# Patient Record
Sex: Male | Born: 1990
Health system: Southern US, Community
[De-identification: ages and names within clinical notes are randomized; demographics above are authoritative.]

## PROBLEM LIST (undated history)

## (undated) DIAGNOSIS — L309 Dermatitis, unspecified: Secondary | ICD-10-CM

## (undated) HISTORY — PX: APPENDECTOMY: SHX54

---

## 2018-01-31 ENCOUNTER — Other Ambulatory Visit: Payer: Self-pay

## 2018-01-31 ENCOUNTER — Emergency Department (HOSPITAL_COMMUNITY)
Admission: EM | Admit: 2018-01-31 | Discharge: 2018-01-31 | Disposition: A | Discharge status: Home / Self Care | Payer: Self-pay | Attending: Emergency Medicine | Admitting: Emergency Medicine

## 2018-01-31 ENCOUNTER — Encounter (HOSPITAL_COMMUNITY): Payer: Self-pay

## 2018-01-31 DIAGNOSIS — Y9389 Activity, other specified: Secondary | ICD-10-CM | POA: Insufficient documentation

## 2018-01-31 DIAGNOSIS — F17200 Nicotine dependence, unspecified, uncomplicated: Secondary | ICD-10-CM | POA: Insufficient documentation

## 2018-01-31 DIAGNOSIS — Z23 Encounter for immunization: Secondary | ICD-10-CM | POA: Insufficient documentation

## 2018-01-31 DIAGNOSIS — Y92009 Unspecified place in unspecified non-institutional (private) residence as the place of occurrence of the external cause: Secondary | ICD-10-CM | POA: Insufficient documentation

## 2018-01-31 DIAGNOSIS — S01551A Open bite of lip, initial encounter: Secondary | ICD-10-CM | POA: Insufficient documentation

## 2018-01-31 DIAGNOSIS — Y999 Unspecified external cause status: Secondary | ICD-10-CM | POA: Insufficient documentation

## 2018-01-31 DIAGNOSIS — W540XXA Bitten by dog, initial encounter: Secondary | ICD-10-CM | POA: Insufficient documentation

## 2018-01-31 MED ORDER — AMOXICILLIN-POT CLAVULANATE 875-125 MG PO TABS: 1.0000 | ORAL_TABLET | Freq: Two times a day (BID) | ORAL | 0 refills | Status: DC

## 2018-01-31 MED ORDER — TETANUS-DIPHTH-ACELL PERTUSSIS 5-2.5-18.5 LF-MCG/0.5 IM SUSP
0.5000 mL | Freq: Once | INTRAMUSCULAR | Status: AC
  Administered 2018-01-31: 0.5 mL via INTRAMUSCULAR
  Filled 2018-01-31: qty 0.5

## 2018-01-31 MED ORDER — AMOXICILLIN-POT CLAVULANATE 875-125 MG PO TABS
1.0000 | ORAL_TABLET | Freq: Once | ORAL | Status: AC
  Administered 2018-01-31: 1 via ORAL
  Filled 2018-01-31: qty 1

## 2018-01-31 NOTE — ED Provider Notes (Signed)
[facility] DEPT Provider Note   CSN: [id number] Arrival date & time: 01/31/18  1621     History   Chief Complaint Chief Complaint  Patient presents with  . Animal Bite    HPI Kyle Terry is a 27 y.o. male who presents to the ED for dog bite to his bottom lip. The injury occurred yesterday. Patient reports that he was playing with his puppy and was in his face and the puppy nipped the patient's bottom lip. Puppy is up to date on immunizations. Patient reports that puppy is not missing any teeth and no foreign body in lip. Patient is not up to date on tetanus. Patient has been cleaning his lip with peroxide.  HPI  History reviewed. No pertinent past medical history.  There are no active problems to display for this patient.   Past Surgical History:  Procedure Laterality Date  . APPENDECTOMY          Home Medications    Prior to Admission medications   Medication Sig Start Date End Date Taking? Authorizing Provider  amoxicillin-clavulanate (AUGMENTIN) 875-125 MG tablet Take 1 tablet by mouth every 12 (twelve) hours. 01/31/18   Ashley Murrain, NP    Family History No family history on file.  Social History Social History   Tobacco Use  . Smoking status: Current Every Day Smoker  Substance Use Topics  . Alcohol use: Yes    Comment: barely  . Drug use: Yes    Types: Marijuana     Allergies   Patient has no known allergies.   Review of Systems Review of Systems  Skin: Positive for wound.  All other systems reviewed and are negative.    Physical Exam Updated Vital Signs BP (!) 136/95 (BP Location: Right Arm)   Pulse 93   Temp 98.2 F (36.8 C) (Oral)   Resp 14   Ht 5\' 7"  (1.702 m)   Wt 63 kg (139 lb)   SpO2 100%   BMI 21.77 kg/m   Physical Exam  Constitutional: He appears well-developed and well-nourished. No distress.  HENT:  Laceration to the inside right side of bottom lip. There is swelling noted,  no drainage. the wound is open.  Eyes: EOM are normal.  Neck: Neck supple.  Cardiovascular: Normal rate.  Pulmonary/Chest: Effort normal.  Abdominal: There is no tenderness.  Musculoskeletal: Normal range of motion.  Neurological: He is alert.  Skin: Skin is warm and dry.  Psychiatric: He has a normal mood and affect.  Nursing note and vitals reviewed.    ED Treatments / Results  Labs (all labs ordered are listed, but only abnormal results are displayed) Labs Reviewed - No data to display  Radiology No results found.  Procedures Procedures (including critical care time)  Medications Ordered in ED Medications  Tdap (BOOSTRIX) injection 0.5 mL (has no administration in time range)  amoxicillin-clavulanate (AUGMENTIN) 875-125 MG per tablet 1 tablet (has no administration in time range)     Initial Impression / Assessment and Plan / ED Course  I have reviewed the triage vital signs and the nursing notes. 27 y.o. male here with dog bite to his lowe lip stable for d/c without fever and does not appear toxic. Will treat for infection with Augmentin. Return precautions discussed with the patient. Patient agrees with plan of care.  Final Clinical Impressions(s) / ED Diagnoses   Final diagnoses:  Open wound of lip due to dog bite    ED Discharge  Orders        Ordered    amoxicillin-clavulanate (AUGMENTIN) 875-125 MG tablet  Every 12 hours     01/31/18 1820       Janit Bern [place], [place] 01/31/18 Greer Ee    Noemi Chapel, MD 02/03/18 1128

## 2018-01-31 NOTE — Discharge Instructions (Addendum)
Use warm salt water rinses for the wound inside your lip. Take the antibiotics as directed and return for any problems. Take tylenol or Advil as needed for pain.

## 2018-01-31 NOTE — ED Notes (Signed)
Patient verbalized understanding of discharge instructions, no questions. Patient ambulated out of ED with steady gait in no distress.  

## 2018-01-31 NOTE — ED Notes (Signed)
Animal bite info faxed to [facility].

## 2018-01-31 NOTE — ED Triage Notes (Signed)
Pt states he was playing with his puppy yesterday and he nipped him in the lip. Pt states puppy has received all of his shots.

## 2018-08-20 ENCOUNTER — Encounter (HOSPITAL_COMMUNITY): Payer: Self-pay | Admitting: Emergency Medicine

## 2018-08-20 ENCOUNTER — Ambulatory Visit (HOSPITAL_COMMUNITY)
Admission: EM | Admit: 2018-08-20 | Discharge: 2018-08-20 | Disposition: A | Discharge status: Home / Self Care | Payer: Self-pay | Attending: Family Medicine | Admitting: Family Medicine

## 2018-08-20 DIAGNOSIS — Z113 Encounter for screening for infections with a predominantly sexual mode of transmission: Secondary | ICD-10-CM | POA: Insufficient documentation

## 2018-08-20 DIAGNOSIS — Z202 Contact with and (suspected) exposure to infections with a predominantly sexual mode of transmission: Secondary | ICD-10-CM

## 2018-08-20 MED ORDER — CEFTRIAXONE SODIUM 250 MG IJ SOLR
INTRAMUSCULAR | Status: AC
  Filled 2018-08-20: qty 250

## 2018-08-20 MED ORDER — CEFTRIAXONE SODIUM 250 MG IJ SOLR
250.0000 mg | Freq: Once | INTRAMUSCULAR | Status: AC
  Administered 2018-08-20: 250 mg via INTRAMUSCULAR

## 2018-08-20 MED ORDER — AZITHROMYCIN 250 MG PO TABS
1000.0000 mg | ORAL_TABLET | Freq: Once | ORAL | Status: AC
  Administered 2018-08-20: 1000 mg via ORAL

## 2018-08-20 MED ORDER — AZITHROMYCIN 250 MG PO TABS
ORAL_TABLET | ORAL | Status: AC
  Filled 2018-08-20: qty 4

## 2018-08-20 NOTE — Discharge Instructions (Signed)
We are screening you for STDs today We will go ahead and treat you prophylactically for gonorrhea and chlamydia Lab results pending we will call with any positive results

## 2018-08-20 NOTE — ED Triage Notes (Signed)
Pt is here for STD check.... he is asymptomatic but reports male partner is being treated for Chlam  Sexually active in a monogamous relationship but does not use condoms.   A&O x4... Ambulatory... NAD

## 2018-08-22 LAB — URINE CYTOLOGY ANCILLARY ONLY
CHLAMYDIA, DNA PROBE: NEGATIVE
Neisseria Gonorrhea: NEGATIVE
Trichomonas: NEGATIVE

## 2018-08-22 NOTE — ED Provider Notes (Signed)
[facility]    CSN: [id number] Arrival date & time: 08/20/18  1336     History   Chief Complaint Chief Complaint  Patient presents with  . Exposure to STD    HPI Kyle Terry is a 28 y.o. male.   Pt is a 28 year old male that presents for STD check. He reports that male partner was recently treated for chlamydia. He is currently sexually active with this partner, unprotected. He denies any dysuria or penile discharge. He denies any symptoms.      History reviewed. No pertinent past medical history.  There are no active problems to display for this patient.   Past Surgical History:  Procedure Laterality Date  . APPENDECTOMY         Home Medications    Prior to Admission medications   Not on File    Family History History reviewed. No pertinent family history.  Social History Social History   Tobacco Use  . Smoking status: Current Every Day Smoker  . Smokeless tobacco: Never Used  Substance Use Topics  . Alcohol use: Yes    Comment: barely  . Drug use: Yes    Types: Marijuana     Allergies   Patient has no known allergies.   Review of Systems Review of Systems  Constitutional: Negative for fever.  Genitourinary: Negative for decreased urine volume, difficulty urinating, discharge, dysuria, enuresis, flank pain, frequency, genital sores, hematuria, penile pain, penile swelling, scrotal swelling, testicular pain and urgency.     Physical Exam Triage Vital Signs ED Triage Vitals  Enc Vitals Group     BP 08/20/18 1350 123/60     Pulse Rate 08/20/18 1350 79     Resp 08/20/18 1350 16     Temp 08/20/18 1350 98.2 F (36.8 C)     Temp Source 08/20/18 1350 Oral     SpO2 08/20/18 1350 100 %     Weight --      Height --      Head Circumference --      Peak Flow --      Pain Score 08/20/18 1434 0     Pain Loc --      Pain Edu? --      Excl. in [place]? --    No data found.  Updated Vital Signs BP 123/60 (BP Location:  Right Arm)   Pulse 79   Temp 98.2 F (36.8 C) (Oral)   Resp 16   SpO2 100%   Visual Acuity Right Eye Distance:   Left Eye Distance:   Bilateral Distance:    Right Eye Near:   Left Eye Near:    Bilateral Near:     Physical Exam Vitals signs and nursing note reviewed.  Constitutional:      Appearance: He is well-developed.  HENT:     Head: Normocephalic and atraumatic.  Eyes:     Conjunctiva/sclera: Conjunctivae normal.  Neck:     Musculoskeletal: Normal range of motion.  Pulmonary:     Effort: Pulmonary effort is normal.  Abdominal:     Palpations: Abdomen is soft.     Tenderness: There is no abdominal tenderness.  Musculoskeletal: Normal range of motion.  Skin:    General: Skin is warm and dry.  Neurological:     Mental Status: He is alert.  Psychiatric:        Mood and Affect: Mood normal.      UC Treatments / Results  Labs (all labs  ordered are listed, but only abnormal results are displayed) Labs Reviewed  URINE CYTOLOGY ANCILLARY ONLY    EKG None  Radiology No results found.  Procedures Procedures (including critical care time)  Medications Ordered in UC Medications  cefTRIAXone (ROCEPHIN) injection 250 mg (250 mg Intramuscular Given 08/20/18 1430)  azithromycin (ZITHROMAX) tablet 1,000 mg (1,000 mg Oral Given 08/20/18 1430)    Initial Impression / Assessment and Plan / UC Course  I have reviewed the triage vital signs and the nursing notes.  Pertinent labs & imaging results that were available during my care of the patient were reviewed by me and considered in my medical decision making (see chart for details).     Treating prophylactic for STDs.  Labs pending.  Final Clinical Impressions(s) / UC Diagnoses   Final diagnoses:  Screen for STD (sexually transmitted disease)     Discharge Instructions     We are screening you for STDs today We will go ahead and treat you prophylactically for gonorrhea and chlamydia Lab results pending  we will call with any positive results     ED Prescriptions    None     Controlled Substance Prescriptions [place] Controlled Substance Registry consulted? Not Applicable   Orvan July, NP 08/22/18 [phone]

## 2019-08-31 ENCOUNTER — Other Ambulatory Visit: Payer: Self-pay

## 2019-08-31 ENCOUNTER — Emergency Department (HOSPITAL_COMMUNITY): Payer: Self-pay

## 2019-08-31 ENCOUNTER — Emergency Department (HOSPITAL_COMMUNITY)
Admission: EM | Admit: 2019-08-31 | Discharge: 2019-08-31 | Disposition: A | Discharge status: Home / Self Care | Payer: Self-pay | Attending: Emergency Medicine | Admitting: Emergency Medicine

## 2019-08-31 ENCOUNTER — Ambulatory Visit (HOSPITAL_COMMUNITY)
Admission: EM | Admit: 2019-08-31 | Discharge: 2019-08-31 | Disposition: A | Discharge status: Home / Self Care | Payer: Self-pay

## 2019-08-31 ENCOUNTER — Encounter (HOSPITAL_COMMUNITY): Payer: Self-pay | Admitting: Emergency Medicine

## 2019-08-31 DIAGNOSIS — I480 Paroxysmal atrial fibrillation: Secondary | ICD-10-CM | POA: Insufficient documentation

## 2019-08-31 DIAGNOSIS — I132 Hypertensive heart and chronic kidney disease with heart failure and with stage 5 chronic kidney disease, or end stage renal disease: Secondary | ICD-10-CM | POA: Insufficient documentation

## 2019-08-31 DIAGNOSIS — M546 Pain in thoracic spine: Secondary | ICD-10-CM | POA: Insufficient documentation

## 2019-08-31 DIAGNOSIS — Z992 Dependence on renal dialysis: Secondary | ICD-10-CM | POA: Insufficient documentation

## 2019-08-31 DIAGNOSIS — I5042 Chronic combined systolic (congestive) and diastolic (congestive) heart failure: Secondary | ICD-10-CM | POA: Insufficient documentation

## 2019-08-31 DIAGNOSIS — G8929 Other chronic pain: Secondary | ICD-10-CM | POA: Insufficient documentation

## 2019-08-31 DIAGNOSIS — Z7982 Long term (current) use of aspirin: Secondary | ICD-10-CM | POA: Insufficient documentation

## 2019-08-31 DIAGNOSIS — Z950 Presence of cardiac pacemaker: Secondary | ICD-10-CM | POA: Insufficient documentation

## 2019-08-31 DIAGNOSIS — N186 End stage renal disease: Secondary | ICD-10-CM | POA: Insufficient documentation

## 2019-08-31 DIAGNOSIS — Z79899 Other long term (current) drug therapy: Secondary | ICD-10-CM | POA: Insufficient documentation

## 2019-08-31 DIAGNOSIS — W540XXA Bitten by dog, initial encounter: Secondary | ICD-10-CM

## 2019-08-31 DIAGNOSIS — Z87891 Personal history of nicotine dependence: Secondary | ICD-10-CM | POA: Insufficient documentation

## 2019-08-31 MED ORDER — IBUPROFEN 400 MG PO TABS
600.0000 mg | ORAL_TABLET | Freq: Once | ORAL | Status: AC
  Administered 2019-08-31: 600 mg via ORAL
  Filled 2019-08-31: qty 1

## 2019-08-31 MED ORDER — RABIES IMMUNE GLOBULIN 150 UNIT/ML IM INJ
20.0000 [IU]/kg | INJECTION | Freq: Once | INTRAMUSCULAR | Status: AC
  Administered 2019-08-31: 1200 [IU] via INTRAMUSCULAR
  Filled 2019-08-31: qty 8

## 2019-08-31 MED ORDER — AMOXICILLIN-POT CLAVULANATE 875-125 MG PO TABS: 1.0000 | ORAL_TABLET | Freq: Two times a day (BID) | ORAL | 0 refills | Status: AC

## 2019-08-31 MED ORDER — RABIES VACCINE, PCEC IM SUSR
1.0000 mL | Freq: Once | INTRAMUSCULAR | Status: AC
  Administered 2019-08-31: 1 mL via INTRAMUSCULAR
  Filled 2019-08-31 (×2): qty 1

## 2019-08-31 MED ORDER — LIDOCAINE HCL 2 % IJ SOLN
10.0000 mL | Freq: Once | INTRAMUSCULAR | Status: AC
  Administered 2019-08-31: 200 mg via INTRADERMAL
  Filled 2019-08-31: qty 20

## 2019-08-31 NOTE — Discharge Instructions (Addendum)
You were given a prescription for antibiotics. Please take the antibiotic prescription fully.   Please follow up for the remainder of your rabies vaccine series as indicated on your discharge paperwork.   Please return to the emergency room immediately if you experience any new or worsening symptoms or any symptoms that indicate worsening infection such as fevers, increased redness/swelling/pain, warmth, or drainage from the affected area.

## 2019-08-31 NOTE — ED Notes (Signed)
Patient verbalizes understanding of discharge instructions. Opportunity for questioning and answers were provided. Pt discharged from ED. 

## 2019-08-31 NOTE — ED Provider Notes (Signed)
[facility] DEPART[facility] Provider Note   CSN: [id number] Arrival date & time: 08/31/19  1239     History Chief Complaint  Patient presents with  . Animal Bite    Kyle Terry is a 29 y.o. male.  HPI   29 year old male presenting for evaluation of a dog bite that happened last night.  Patient states he was walking his dog in the neighborhood when another dog bit him to the left ankle and left foot.  He is not sure with the breed of the dog is comes he states it is a mix but that it is medium sized.  He reports multiple puncture wounds to the foot and ankle.  The majority of his pain is located to the left foot.  It is worse when he walks.  He states his Tdap is up-to-date.  History reviewed. No pertinent past medical history.  There are no problems to display for this patient.   Past Surgical History:  Procedure Laterality Date  . APPENDECTOMY         No family history on file.  Social History   Tobacco Use  . Smoking status: Current Every Day Smoker  . Smokeless tobacco: Never Used  Substance Use Topics  . Alcohol use: Yes    Comment: barely  . Drug use: Yes    Types: Marijuana    Home Medications Prior to Admission medications   Medication Sig Start Date End Date Taking? Authorizing Provider  amoxicillin-clavulanate (AUGMENTIN) 875-125 MG tablet Take 1 tablet by mouth every 12 (twelve) hours for 7 days. 08/31/19 09/07/19  Ryana Montecalvo S, PA-C    Allergies    Patient has no known allergies.  Review of Systems   Review of Systems  Constitutional: Negative for fever.  Musculoskeletal:       Left foot pain  Skin: Positive for wound.  Neurological: Negative for weakness and numbness.    Physical Exam Updated Vital Signs BP 112/73   Pulse 70   Temp 98.1 F (36.7 C) (Oral)   Resp 16   Ht 5\' 8"  (1.727 m)   Wt 59 kg   SpO2 98%   BMI 19.77 kg/m   Physical Exam Constitutional:      General: He is not in acute  distress.    Appearance: He is well-developed.  Eyes:     Conjunctiva/sclera: Conjunctivae normal.  Cardiovascular:     Rate and Rhythm: Normal rate and regular rhythm.  Pulmonary:     Effort: Pulmonary effort is normal.     Breath sounds: Normal breath sounds.  Skin:    General: Skin is warm and dry.     Comments: About 3 puncture wounds that are superficial are noted to the dorsum of the left foot.  There are also several puncture wounds to the plantar surface of the left foot and a larger puncture wound noted just posterior to the medial malleolus.  There is also superficial puncture wounds noted to the medial and lateral malleoli.  Foot is swollen and there is tenderness to palpation over the puncture wounds.  Normal sensation.  Normal distal pulses.  Decreased range of motion secondary to pain.  Neurological:     Mental Status: He is alert and oriented to person, place, and time.     ED Results / Procedures / Treatments   Labs (all labs ordered are listed, but only abnormal results are displayed) Labs Reviewed - No data to display  EKG None  Radiology  DG Ankle Complete Left  Result Date: 08/31/2019 CLINICAL DATA:  Pain following dog bite EXAM: LEFT ANKLE COMPLETE - 3+ VIEW COMPARISON:  None. FINDINGS: Frontal, oblique, and lateral views were obtained. There is no evident fracture or joint effusion. No soft tissue air or radiopaque foreign body. No joint space narrowing or erosion. Small calcifications are noted in the dorsal talonavicular joint, likely of arthropathic etiology or possibly representing residua of old trauma. IMPRESSION: No fracture or soft tissue air. No radiopaque foreign body. No appreciable arthropathy. Ankle mortise appears intact. Question residua of old trauma along the dorsal distal talus and talonavicular joint. Electronically Signed   By: Lowella Grip III M.D.   On: 08/31/2019 14:58   DG Foot Complete Left  Result Date: 08/31/2019 CLINICAL DATA:   Pain following dog bite EXAM: LEFT FOOT - COMPLETE 3+ VIEW COMPARISON:  None. FINDINGS: Frontal, oblique, and lateral views were obtained. No fracture or dislocation. Small calcifications dorsal to the distal talus and talonavicular joint may represent residua of old trauma. No soft tissue air or radiopaque foreign body. No joint space narrowing or erosion. There is pes planus. IMPRESSION: No fracture or dislocation. Small calcifications dorsal to the talonavicular joint and distal talus regions likely represent residua of old trauma. No appreciable arthropathy. No soft tissue air or radiopaque foreign body. Pes planus. Electronically Signed   By: Lowella Grip III M.D.   On: 08/31/2019 14:59    Procedures Procedures (including critical care time)  Medications Ordered in ED Medications  rabies immune globulin (HYPERAB/KEDRAB) injection 1,200 Units (1,200 Units Intramuscular Given 08/31/19 1546)  rabies vaccine (RABAVERT) injection 1 mL (1 mL Intramuscular Given 08/31/19 1540)  ibuprofen (ADVIL) tablet 600 mg (600 mg Oral Given 08/31/19 1500)  lidocaine (XYLOCAINE) 2 % (with pres) injection 200 mg (200 mg Intradermal Given by Other 08/31/19 1605)    ED Course  I have reviewed the triage vital signs and the nursing notes.  Pertinent labs & imaging results that were available during my care of the patient were reviewed by me and considered in my medical decision making (see chart for details).    MDM Rules/Calculators/A&P                      Patient presents with laceration from a dog bite.  Pt wounds were pressure irrigated well with with sterile saline.  Wounds examined with visualization of the base and no foreign bodies seen.  Pt Alert and oriented, NAD, nontoxic, nonseptic appearing.  Capillary refill intact and pt without neurologic deficit.  x-rays personally reviewed and I agree with radiology that there is no evidence of acute fracture but there is evidence of calcifications from an old  injury to the talus. Patient tetanus is UTD.  Patient rabies vaccine and immunoglobulin risk and benefit discussed.  Pt consents. Pain treated in the emergency department. Wounds not closed secondary to concern for infection. We'll discharge home with pain medication, Augmentin and requests for close follow-up with PCP or back in the ER.   Final Clinical Impression(s) / ED Diagnoses Final diagnoses:  Dog bite, initial encounter    Rx / DC Orders ED Discharge Orders         Ordered    amoxicillin-clavulanate (AUGMENTIN) 875-125 MG tablet  Every 12 hours     08/31/19 1536           Shilynn Hoch S, PA-C 08/31/19 1853    Lennice Sites, DO 09/03/19 1455

## 2019-08-31 NOTE — ED Triage Notes (Signed)
Patient states he was bit by a dog about 7-8pm last night. The dog is known and lives in his neighborhood but unsure if up to date on shots. Reports puncture would to inside left ankle/upper foot.

## 2019-08-31 NOTE — ED Notes (Signed)
Spoke with pt and mother that since pt was bitten by a dog with unknown vaccine status, that pt needs to go to ER for care so he may receive rabies vaccine series that we cannot be provide.

## 2020-07-22 ENCOUNTER — Other Ambulatory Visit: Payer: Self-pay

## 2020-07-22 DIAGNOSIS — Z20822 Contact with and (suspected) exposure to covid-19: Secondary | ICD-10-CM

## 2020-07-26 LAB — NOVEL CORONAVIRUS, NAA: SARS-CoV-2, NAA: NOT DETECTED

## 2021-01-08 ENCOUNTER — Encounter (HOSPITAL_BASED_OUTPATIENT_CLINIC_OR_DEPARTMENT_OTHER): Payer: Self-pay | Admitting: Emergency Medicine

## 2021-01-08 ENCOUNTER — Other Ambulatory Visit: Payer: Self-pay

## 2021-01-08 ENCOUNTER — Emergency Department (HOSPITAL_BASED_OUTPATIENT_CLINIC_OR_DEPARTMENT_OTHER)
Admission: EM | Admit: 2021-01-08 | Discharge: 2021-01-08 | Disposition: A | Discharge status: Home / Self Care | Payer: Self-pay | Attending: Emergency Medicine | Admitting: Emergency Medicine

## 2021-01-08 DIAGNOSIS — K0889 Other specified disorders of teeth and supporting structures: Secondary | ICD-10-CM | POA: Insufficient documentation

## 2021-01-08 DIAGNOSIS — F17200 Nicotine dependence, unspecified, uncomplicated: Secondary | ICD-10-CM | POA: Insufficient documentation

## 2021-01-08 MED ORDER — CLINDAMYCIN HCL 300 MG PO CAPS: 300.0000 mg | ORAL_CAPSULE | Freq: Three times a day (TID) | ORAL | 0 refills | Status: AC

## 2021-01-08 MED ORDER — OXYCODONE-ACETAMINOPHEN 5-325 MG PO TABS: 1.0000 | ORAL_TABLET | Freq: Four times a day (QID) | ORAL | 0 refills | Status: DC | PRN

## 2021-01-08 NOTE — ED Provider Notes (Signed)
[facility] Provider Note   CSN: [id number] Arrival date & time: 01/08/21  2110     History Chief Complaint  Patient presents with   Dental Pain    Kyle Terry is a 30 y.o. male.  Patient presented with chief complaint of right lower posterior wisdom tooth pain ongoing for 3 to 4 weeks.  He seen a dentist for this before but has yet to schedule any procedure to take care of it.  He denies any increasing pain over the course of last week taking Motrin without significant improvement.  Denies fevers or cough or vomiting or diarrhea.      No past medical history on file.  There are no problems to display for this patient.   Past Surgical History:  Procedure Laterality Date   APPENDECTOMY         No family history on file.  Social History   Tobacco Use   Smoking status: Every Day    Pack years: 0.00   Smokeless tobacco: Never  Substance Use Topics   Alcohol use: Yes    Comment: barely   Drug use: Yes    Types: Marijuana    Home Medications Prior to Admission medications   Medication Sig Start Date End Date Taking? Authorizing Provider  clindamycin (CLEOCIN) 300 MG capsule Take 1 capsule (300 mg total) by mouth 3 (three) times daily for 7 days. 01/08/21 01/15/21 Yes Calirose Mccance, Greggory Brandy, MD  oxyCODONE-acetaminophen (PERCOCET/ROXICET) 5-325 MG tablet Take 1 tablet by mouth every 6 (six) hours as needed for up to 8 doses for severe pain. 01/08/21  Yes Luna Fuse, MD    Allergies    Patient has no known allergies.  Review of Systems   Review of Systems  Constitutional:  Negative for fever.  HENT:  Negative for ear pain and sore throat.   Eyes:  Negative for pain.  Respiratory:  Negative for cough.   Cardiovascular:  Negative for chest pain.  Gastrointestinal:  Negative for abdominal pain.  Genitourinary:  Negative for flank pain.  Musculoskeletal:  Negative for back pain.  Skin:  Negative for color change and rash.   Neurological:  Negative for syncope.  All other systems reviewed and are negative.  Physical Exam Updated Vital Signs BP 130/77 (BP Location: Right Arm)   Pulse 74   Temp 98.5 F (36.9 C) (Oral)   Resp 17   Ht '5\' 7"'$  (1.702 m)   Wt 63.5 kg   SpO2 97%   BMI 21.93 kg/m   Physical Exam Constitutional:      General: He is not in acute distress.    Appearance: He is well-developed.  HENT:     Head: Normocephalic.     Nose: Nose normal.     Mouth/Throat:     Comments: Right lower posterior molar dental caries present.  No gumline swelling or abscess noted. Eyes:     Extraocular Movements: Extraocular movements intact.  Cardiovascular:     Rate and Rhythm: Normal rate.  Pulmonary:     Effort: Pulmonary effort is normal.  Skin:    Coloration: Skin is not jaundiced.  Neurological:     Mental Status: He is alert. Mental status is at baseline.    ED Results / Procedures / Treatments   Labs (all labs ordered are listed, but only abnormal results are displayed) Labs Reviewed - No data to display  EKG None  Radiology No results found.  Procedures Procedures   Medications Ordered  in ED Medications - No data to display  ED Course  I have reviewed the triage vital signs and the nursing notes.  Pertinent labs & imaging results that were available during my care of the patient were reviewed by me and considered in my medical decision making (see chart for details).    MDM Rules/Calculators/A&P                          Patient presents with dental pain.  Will be given prescription antibiotics and narcotic medication to take at home.  Advised him to follow-up with his dentist within the week.  Advised immediate return if he has fevers worsening pain or any additional concerns.  Final Clinical Impression(s) / ED Diagnoses Final diagnoses:  Pain, dental    Rx / DC Orders ED Discharge Orders          Ordered    oxyCODONE-acetaminophen (PERCOCET/ROXICET) 5-325 MG  tablet  Every 6 hours PRN        01/08/21 2307    clindamycin (CLEOCIN) 300 MG capsule  3 times daily        01/08/21 2307             Luna Fuse, MD 01/08/21 2307

## 2021-01-08 NOTE — ED Triage Notes (Signed)
Pt has had ongoing right back lower dental pain. Has taken anbx in the past and pain continues to flare back up.  Ibuprofen does not work for his pain .

## 2021-01-08 NOTE — Discharge Instructions (Addendum)
Call your primary care doctor or specialist as discussed in the next 2-3 days.   Return immediately back to the ER if:  Your symptoms worsen within the next 12-24 hours. You develop new symptoms such as new fevers, persistent vomiting, new pain, shortness of breath, or new weakness or numbness, or if you have any other concerns.  

## 2021-01-08 NOTE — ED Notes (Signed)
This RN presented the AVS utilizing Teachback Method. Patient verbalizes understanding of Discharge Instructions. Opportunity for Questioning and Answers were provided. Patient Discharged from ED ambulatory to Home.   

## 2021-04-06 ENCOUNTER — Other Ambulatory Visit: Payer: Self-pay

## 2021-04-06 ENCOUNTER — Ambulatory Visit (HOSPITAL_COMMUNITY): Admission: EM | Admit: 2021-04-06 | Discharge: 2021-04-06 | Discharge status: Against Medical Advice | Payer: Self-pay

## 2021-04-06 NOTE — ED Notes (Signed)
No answer in waiting area x2 

## 2021-04-07 ENCOUNTER — Other Ambulatory Visit: Payer: Self-pay

## 2021-04-07 ENCOUNTER — Ambulatory Visit
Admission: EM | Admit: 2021-04-07 | Discharge: 2021-04-07 | Disposition: A | Discharge status: Home / Self Care | Payer: Self-pay | Attending: Internal Medicine | Admitting: Internal Medicine

## 2021-04-07 ENCOUNTER — Encounter: Payer: Self-pay | Admitting: Emergency Medicine

## 2021-04-07 DIAGNOSIS — Z9109 Other allergy status, other than to drugs and biological substances: Secondary | ICD-10-CM

## 2021-04-07 MED ORDER — CETIRIZINE HCL 10 MG PO TABS: 10.0000 mg | ORAL_TABLET | Freq: Every day | ORAL | 2 refills | Status: DC

## 2021-04-07 NOTE — ED Triage Notes (Signed)
Hx of eye problems. Works at a [employer] and is starting to have a rash/skin reaction to their products. Also c/o getting something in left eye recently from work.

## 2021-04-07 NOTE — ED Provider Notes (Signed)
[facility]    CSN: [id number] Arrival date & time: 04/07/21  1101      History   Chief Complaint Chief Complaint  Patient presents with   Eye Problem    HPI Kyle Terry is a 30 y.o. male.   Patient presents with irritation of skin on arms and face as well as irritation to eyes that started when he started working at his current place of employment.  Patient works for a [employer] cabin [employer] and has to [employer] daily.  Patient reports that the powdery chemicals are in the air and get on his skin.  Patient has an intermittent rash to arms and cheeks of face as well as red and watery eyes while he is in the workplace.  Denies getting anything in his eyes or any current eye pain.  Denies any known fevers.  Currently take any antihistamines.  Denies history of allergies that he knows of.  Denies any blurry vision.   Eye Problem  History reviewed. No pertinent past medical history.  There are no problems to display for this patient.   Past Surgical History:  Procedure Laterality Date   APPENDECTOMY         Home Medications    Prior to Admission medications   Medication Sig Start Date End Date Taking? Authorizing Provider  cetirizine (ZYRTEC) 10 MG tablet Take 1 tablet (10 mg total) by mouth daily. 04/07/21  Yes Odis Luster, FNP  oxyCODONE-acetaminophen (PERCOCET/ROXICET) 5-325 MG tablet Take 1 tablet by mouth every 6 (six) hours as needed for up to 8 doses for severe pain. 01/08/21   Luna Fuse, MD    Family History History reviewed. No pertinent family history.  Social History Social History   Tobacco Use   Smoking status: Every Day   Smokeless tobacco: Never  Substance Use Topics   Alcohol use: Yes    Comment: barely   Drug use: Yes    Types: Marijuana     Allergies   Patient has no known allergies.   Review of Systems Review of Systems  Per HPI Physical Exam Triage Vital Signs ED Triage Vitals   Enc Vitals Group     BP 04/07/21 1151 114/74     Pulse Rate 04/07/21 1151 76     Resp 04/07/21 1151 16     Temp 04/07/21 1151 98.1 F (36.7 C)     Temp Source 04/07/21 1151 Oral     SpO2 04/07/21 1151 98 %     Weight --      Height --      Head Circumference --      Peak Flow --      Pain Score 04/07/21 1152 5     Pain Loc --      Pain Edu? --      Excl. in [place]? --    No data found.  Updated Vital Signs BP 114/74 (BP Location: Right Arm)   Pulse 76   Temp 98.1 F (36.7 C) (Oral)   Resp 16   SpO2 98%   Visual Acuity Right Eye Distance:   Left Eye Distance:   Bilateral Distance:    Right Eye Near:   Left Eye Near:    Bilateral Near:     Physical Exam Constitutional:      General: He is not in acute distress.    Appearance: Normal appearance. He is not toxic-appearing or diaphoretic.  HENT:     Head:  Normocephalic and atraumatic.     Right Ear: Tympanic membrane and ear canal normal.     Left Ear: Tympanic membrane and ear canal normal.     Nose: Nose normal.     Mouth/Throat:     Mouth: Mucous membranes are moist.     Pharynx: No posterior oropharyngeal erythema.  Eyes:     General: Lids are normal. Lids are everted, no foreign bodies appreciated.     Extraocular Movements: Extraocular movements intact.     Conjunctiva/sclera: Conjunctivae normal.     Pupils: Pupils are equal, round, and reactive to light.  Cardiovascular:     Rate and Rhythm: Normal rate and regular rhythm.  Pulmonary:     Effort: Pulmonary effort is normal.     Breath sounds: Normal breath sounds.  Skin:    General: Skin is warm and dry.     Findings: Rash present. Rash is urticarial.     Comments: Flesh-colored papular rash present to bilateral cheeks of face.  Similar rash present to right forearm.  Neurological:     General: No focal deficit present.     Mental Status: He is alert and oriented to person, place, and time. Mental status is at baseline.  Psychiatric:        Mood and  Affect: Mood normal.        Behavior: Behavior normal.        Thought Content: Thought content normal.        Judgment: Judgment normal.     UC Treatments / Results  Labs (all labs ordered are listed, but only abnormal results are displayed) Labs Reviewed - No data to display  EKG   Radiology No results found.  Procedures Procedures (including critical care time)  Medications Ordered in UC Medications - No data to display  Initial Impression / Assessment and Plan / UC Course  I have reviewed the triage vital signs and the nursing notes.  Pertinent labs & imaging results that were available during my care of the patient were reviewed by me and considered in my medical decision making (see chart for details).     It appears that patient is having allergic reaction to environmental allergens at workplace.  Advised patient that best treatment is to be removed from environment  allergens at workplace.  Patient prescribed cetirizine daily antihistamine to help alleviate and prevent allergic reaction.  Advised patient to follow-up with PCP or urgent care if symptoms persist.  No signs of corneal abrasion or eye irritation on exam.  Eyes appear normal on exam.  Discussed strict return precautions. Patient verbalized understanding and is agreeable with plan.  Final Clinical Impressions(s) / UC Diagnoses   Final diagnoses:  Environmental allergies     Discharge Instructions      You have been prescribed cetirizine to take daily to help with your symptoms and allergic reaction.     ED Prescriptions     Medication Sig Dispense Auth. Provider   cetirizine (ZYRTEC) 10 MG tablet Take 1 tablet (10 mg total) by mouth daily. 30 tablet Odis Luster, FNP      PDMP not reviewed this encounter.   Odis Luster, FNP 04/07/21 1233

## 2021-04-07 NOTE — Discharge Instructions (Addendum)
You have been prescribed cetirizine to take daily to help with your symptoms and allergic reaction.

## 2021-05-04 ENCOUNTER — Other Ambulatory Visit: Payer: Self-pay

## 2021-05-04 ENCOUNTER — Ambulatory Visit
Admission: EM | Admit: 2021-05-04 | Discharge: 2021-05-04 | Disposition: A | Discharge status: Home / Self Care | Payer: Self-pay | Attending: Internal Medicine | Admitting: Internal Medicine

## 2021-05-04 DIAGNOSIS — K047 Periapical abscess without sinus: Secondary | ICD-10-CM

## 2021-05-04 DIAGNOSIS — K0889 Other specified disorders of teeth and supporting structures: Secondary | ICD-10-CM

## 2021-05-04 MED ORDER — AMOXICILLIN 875 MG PO TABS: 875.0000 mg | ORAL_TABLET | Freq: Two times a day (BID) | ORAL | 0 refills | Status: AC

## 2021-05-04 MED ORDER — LIDOCAINE VISCOUS HCL 2 % MT SOLN: 15.0000 mL | OROMUCOSAL | 0 refills | Status: DC | PRN

## 2021-05-04 NOTE — ED Triage Notes (Signed)
Pt c/o tooth pain onset early this morning. States he has tried tylenol and motrin at home without relief. States he thinks it is infected. 7/10 throbbing. Has had this pain before about a year ago. States he has [employer], [employer]/clothing but does not start for 6 months.

## 2021-05-04 NOTE — Discharge Instructions (Addendum)
You have a tooth infection.  You have been prescribed amoxicillin antibiotic to help treat this.  You have also been prescribed viscous lidocaine that you may apply to the affected area of pain.  Please follow-up with dentist as soon as possible.

## 2021-05-04 NOTE — ED Provider Notes (Signed)
[facility]    CSN: [id number] Arrival date & time: 05/04/21  [id number]      History   Chief Complaint Chief Complaint  Patient presents with   Dental Pain    HPI Kyle Terry is a 30 y.o. male.   Patient reports with right-sided lower dental pain that started this morning.  Patient reports that he has had pain and infection in this area of the mouth before.  Patient is not able to get in with dentist at this time as he does not have dental insurance for approximately 6 months.  Patient denies any purulent drainage in the mouth or any known fevers.  Denies any trauma to the mouth.  Denies any difficulty swallowing or any shortness of breath.  Has been taking ibuprofen for pain with minimal improvement.   Dental Pain  History reviewed. No pertinent past medical history.  There are no problems to display for this patient.   Past Surgical History:  Procedure Laterality Date   APPENDECTOMY         Home Medications    Prior to Admission medications   Medication Sig Start Date End Date Taking? Authorizing Provider  amoxicillin (AMOXIL) 875 MG tablet Take 1 tablet (875 mg total) by mouth 2 (two) times daily for 10 days. 05/04/21 05/14/21 Yes Sartaj Hoskin, Hildred Alamin E, FNP  lidocaine (XYLOCAINE) 2 % solution Use as directed 15 mLs in the mouth or throat as needed for mouth pain. You may apply to affected tooth 05/04/21  Yes Meiko Ives, [place] E, FNP  cetirizine (ZYRTEC) 10 MG tablet Take 1 tablet (10 mg total) by mouth daily. 04/07/21   Teodora Medici, FNP  oxyCODONE-acetaminophen (PERCOCET/ROXICET) 5-325 MG tablet Take 1 tablet by mouth every 6 (six) hours as needed for up to 8 doses for severe pain. 01/08/21   Luna Fuse, MD    Family History History reviewed. No pertinent family history.  Social History Social History   Tobacco Use   Smoking status: Every Day   Smokeless tobacco: Never  Substance Use Topics   Alcohol use: Yes    Comment: barely   Drug use:  Yes    Types: Marijuana     Allergies   Patient has no known allergies.   Review of Systems Review of Systems Per HPI  Physical Exam Triage Vital Signs ED Triage Vitals  Enc Vitals Group     BP 05/04/21 0957 124/79     Pulse Rate 05/04/21 0957 81     Resp 05/04/21 0957 16     Temp 05/04/21 0957 98.3 F (36.8 C)     Temp Source 05/04/21 0957 Oral     SpO2 05/04/21 0957 99 %     Weight --      Height --      Head Circumference --      Peak Flow --      Pain Score 05/04/21 1006 7     Pain Loc --      Pain Edu? --      Excl. in [place]? --    No data found.  Updated Vital Signs BP 124/79 (BP Location: Left Arm)   Pulse 81   Temp 98.3 F (36.8 C) (Oral)   Resp 16   SpO2 99%   Visual Acuity Right Eye Distance:   Left Eye Distance:   Bilateral Distance:    Right Eye Near:   Left Eye Near:    Bilateral Near:     Physical  Exam Constitutional:      General: He is not in acute distress.    Appearance: Normal appearance. He is not toxic-appearing or diaphoretic.  HENT:     Head: Normocephalic and atraumatic.     Mouth/Throat:     Lips: Pink.     Mouth: Mucous membranes are moist.     Dentition: Abnormal dentition. Does not have dentures. Dental tenderness and gingival swelling present. No dental abscesses or gum lesions.      Comments: Area of swelling and redness to the gingiva surrounding right lower back tooth. Eyes:     Extraocular Movements: Extraocular movements intact.     Conjunctiva/sclera: Conjunctivae normal.  Pulmonary:     Effort: Pulmonary effort is normal.  Neurological:     General: No focal deficit present.     Mental Status: He is alert and oriented to person, place, and time. Mental status is at baseline.  Psychiatric:        Mood and Affect: Mood normal.        Behavior: Behavior normal.        Thought Content: Thought content normal.        Judgment: Judgment normal.     UC Treatments / Results  Labs (all labs ordered are listed,  but only abnormal results are displayed) Labs Reviewed - No data to display  EKG   Radiology No results found.  Procedures Procedures (including critical care time)  Medications Ordered in UC Medications - No data to display  Initial Impression / Assessment and Plan / UC Course  I have reviewed the triage vital signs and the nursing notes.  Pertinent labs & imaging results that were available during my care of the patient were reviewed by me and considered in my medical decision making (see chart for details).     Physical exam is consistent with dental infection.  Will treat with amoxicillin x10 days.  Viscous lidocaine to help with pain.  Advised patient of the importance of following up with dentist soon as possible for further evaluation and management.  No red flags seen on exam.Discussed strict return precautions. Patient verbalized understanding and is agreeable with plan.  Final Clinical Impressions(s) / UC Diagnoses   Final diagnoses:  Dental infection  Pain, dental     Discharge Instructions      You have a tooth infection.  You have been prescribed amoxicillin antibiotic to help treat this.  You have also been prescribed viscous lidocaine that you may apply to the affected area of pain.  Please follow-up with dentist as soon as possible.     ED Prescriptions     Medication Sig Dispense Auth. Provider   amoxicillin (AMOXIL) 875 MG tablet Take 1 tablet (875 mg total) by mouth 2 (two) times daily for 10 days. 20 tablet [place], [place] E, [place]   lidocaine (XYLOCAINE) 2 % solution Use as directed 15 mLs in the mouth or throat as needed for mouth pain. You may apply to affected tooth 100 mL Teodora Medici, [place]      PDMP not reviewed this encounter.   Teodora Medici, [place] 05/04/21 1039

## 2021-05-28 ENCOUNTER — Other Ambulatory Visit: Payer: Self-pay

## 2021-05-28 ENCOUNTER — Emergency Department (HOSPITAL_BASED_OUTPATIENT_CLINIC_OR_DEPARTMENT_OTHER)
Admission: EM | Admit: 2021-05-28 | Discharge: 2021-05-28 | Disposition: A | Discharge status: Home / Self Care | Payer: Self-pay | Attending: Emergency Medicine | Admitting: Emergency Medicine

## 2021-05-28 ENCOUNTER — Encounter (HOSPITAL_BASED_OUTPATIENT_CLINIC_OR_DEPARTMENT_OTHER): Payer: Self-pay | Admitting: Obstetrics and Gynecology

## 2021-05-28 DIAGNOSIS — K0889 Other specified disorders of teeth and supporting structures: Secondary | ICD-10-CM | POA: Insufficient documentation

## 2021-05-28 DIAGNOSIS — F17210 Nicotine dependence, cigarettes, uncomplicated: Secondary | ICD-10-CM | POA: Insufficient documentation

## 2021-05-28 MED ORDER — BUPIVACAINE-EPINEPHRINE (PF) 0.5% -1:200000 IJ SOLN
1.8000 mL | Freq: Once | INTRAMUSCULAR | Status: AC
  Administered 2021-05-28: 1.8 mL
  Filled 2021-05-28: qty 1.8

## 2021-05-28 NOTE — ED Provider Notes (Signed)
[facility] Provider Note   CSN: [id number] Arrival date & time: 05/28/21  1213     History Chief Complaint  Patient presents with   Dental Pain    Kyle Terry is a 30 y.o. male.  30 yo M with a chief complaints of right lower dental pain.  This been off and on for months now.  He was seen about a month ago and was started on antibiotics with some improvement.  Had recurrence feels like his pain is worse when he has to bite down forcefully.  Denies fevers denies difficulty swallowing or breathing.  The history is provided by the patient.  Dental Pain Location:  Lower Lower teeth location:  32/RL 3rd molar Quality:  Aching, sharp and pulsating Severity:  Moderate Onset quality:  Gradual Duration:  3 months Timing:  Intermittent Progression:  Waxing and waning Chronicity:  New Relieved by:  Nothing Worsened by:  Nothing Ineffective treatments:  None tried Associated symptoms: no congestion, no facial swelling, no fever and no headaches       History reviewed. No pertinent past medical history.  There are no problems to display for this patient.   Past Surgical History:  Procedure Laterality Date   APPENDECTOMY         No family history on file.  Social History   Tobacco Use   Smoking status: Every Day    Types: Cigarettes, Cigars    Passive exposure: Never   Smokeless tobacco: Never  Vaping Use   Vaping Use: Former  Substance Use Topics   Alcohol use: Yes    Comment: barely   Drug use: Yes    Types: Marijuana    Home Medications Prior to Admission medications   Medication Sig Start Date End Date Taking? Authorizing Provider  cetirizine (ZYRTEC) 10 MG tablet Take 1 tablet (10 mg total) by mouth daily. 04/07/21   Teodora Medici, FNP  lidocaine (XYLOCAINE) 2 % solution Use as directed 15 mLs in the mouth or throat as needed for mouth pain. You may apply to affected tooth 05/04/21   Teodora Medici, FNP   oxyCODONE-acetaminophen (PERCOCET/ROXICET) 5-325 MG tablet Take 1 tablet by mouth every 6 (six) hours as needed for up to 8 doses for severe pain. 01/08/21   Luna Fuse, MD    Allergies    Patient has no known allergies.  Review of Systems   Review of Systems  Constitutional:  Negative for chills and fever.  HENT:  Positive for dental problem. Negative for congestion and facial swelling.   Eyes:  Negative for discharge and visual disturbance.  Respiratory:  Negative for shortness of breath.   Cardiovascular:  Negative for chest pain and palpitations.  Gastrointestinal:  Negative for abdominal pain, diarrhea and vomiting.  Musculoskeletal:  Negative for arthralgias and myalgias.  Skin:  Negative for color change and rash.  Neurological:  Negative for tremors, syncope and headaches.  Psychiatric/Behavioral:  Negative for confusion and dysphoric mood.    Physical Exam Updated Vital Signs BP 125/74 (BP Location: Right Arm)   Pulse 85   Temp 98.1 F (36.7 C) (Tympanic)   Resp 16   Ht 5\' 8"  (1.727 m)   Wt 61.2 kg   SpO2 100%   BMI 20.53 kg/m   Physical Exam Vitals and nursing note reviewed.  Constitutional:      Appearance: He is well-developed.  HENT:     Head: Normocephalic and atraumatic.     Mouth/Throat:  Comments: Impacted wisdom tooth to bilateral lowers.  No sublingual swelling.  Tolerating secretions without difficulty.  Able to range his neck without pain. Eyes:     Pupils: Pupils are equal, round, and reactive to light.  Neck:     Vascular: No JVD.  Cardiovascular:     Rate and Rhythm: Normal rate and regular rhythm.     Heart sounds: No murmur heard.   No friction rub. No gallop.  Pulmonary:     Effort: No respiratory distress.     Breath sounds: No wheezing.  Abdominal:     General: There is no distension.     Tenderness: There is no abdominal tenderness. There is no guarding or rebound.  Musculoskeletal:        General: Normal range of motion.      Cervical back: Normal range of motion and neck supple.  Skin:    Coloration: Skin is not pale.     Findings: No rash.  Neurological:     Mental Status: He is alert and oriented to person, place, and time.  Psychiatric:        Behavior: Behavior normal.    ED Results / Procedures / Treatments   Labs (all labs ordered are listed, but only abnormal results are displayed) Labs Reviewed - No data to display  EKG None  Radiology No results found.  Procedures Dental Block  Date/Time: 05/28/2021 3:19 PM Performed by: Deno Etienne, DO Authorized by: Deno Etienne, DO   Consent:    Consent obtained:  Verbal   Consent given by:  Patient   Risks, benefits, and alternatives were discussed: yes     Risks discussed:  Allergic reaction, infection, nerve damage, hematoma, intravascular injection and pain   Alternatives discussed:  No treatment, delayed treatment and alternative treatment Universal protocol:    Procedure explained and questions answered to patient or proxy's satisfaction: yes     Immediately prior to procedure, a time out was called: yes     Patient identity confirmed:  Verbally with patient Indications:    Indications: dental pain   Location:    Block type:  Inferior alveolar   Laterality:  Right Procedure details:    Syringe type:  Aspirating dental syringe   Needle gauge:  27 G   Anesthetic injected:  Bupivacaine 0.5% WITH epi   Injection procedure:  Anatomic landmarks identified, anatomic landmarks palpated, introduced needle, negative aspiration for blood and incremental injection Post-procedure details:    Outcome:  Anesthesia achieved   Procedure completion:  Tolerated well, no immediate complications   Medications Ordered in ED Medications  bupivacaine-epinephrine (MARCAINE W/ EPI) 0.5% -1:200000 injection 1.8 mL (1.8 mLs Infiltration Given by Other 05/28/21 1430)    ED Course  I have reviewed the triage vital signs and the nursing notes.  Pertinent  labs & imaging results that were available during my care of the patient were reviewed by me and considered in my medical decision making (see chart for details).    MDM Rules/Calculators/A&P                           30 yo M with a chief complaint of right lower dental pain.  This been going on for a few months now.  Clinically the patient has an impacted wisdom tooth.  He does not have insurance but plans on getting it at the first of the year.  Dental block performed with significant improvement of his  discomfort.  Given follow-up information for the oral surgeon on-call as well as our current dental list.  3:21 PM:  I have discussed the diagnosis/risks/treatment options with the patient and believe the pt to be eligible for discharge home to follow-up with Oral surgery, dentistry. We also discussed returning to the ED immediately if new or worsening sx occur. We discussed the sx which are most concerning (e.g., sudden worsening pain, fever, inability to tolerate by mouth) that necessitate immediate return. Medications administered to the patient during their visit and any new prescriptions provided to the patient are listed below.  Medications given during this visit Medications  bupivacaine-epinephrine (MARCAINE W/ EPI) 0.5% -1:200000 injection 1.8 mL (1.8 mLs Infiltration Given by Other 05/28/21 1430)     The patient appears reasonably screen and/or stabilized for discharge and I doubt any other medical condition or other [facility] requiring further screening, evaluation, or treatment in the ED at this time prior to discharge.   Final Clinical Impression(s) / ED Diagnoses Final diagnoses:  Pain, dental    Rx / DC Orders ED Discharge Orders     None        Deno Etienne, DO 05/28/21 1521

## 2021-05-28 NOTE — ED Triage Notes (Signed)
Patient reports to the ER for dental pain that has been worsening and his dental insurance won't start until January.

## 2021-05-28 NOTE — Discharge Instructions (Signed)
Take 4 over the counter ibuprofen tablets 3 times a day or 2 over-the-counter naproxen tablets twice a day for pain. Also take tylenol 1000mg(2 extra strength) four times a day.    

## 2021-06-24 IMAGING — DX DG FOOT COMPLETE 3+V*L*
3 series · 3 of 3 positions shown · non-contrast
Comparison: None.

CLINICAL DATA: Pain following dog bite

EXAM:
LEFT FOOT - COMPLETE 3+ VIEW

[x foot lat left]
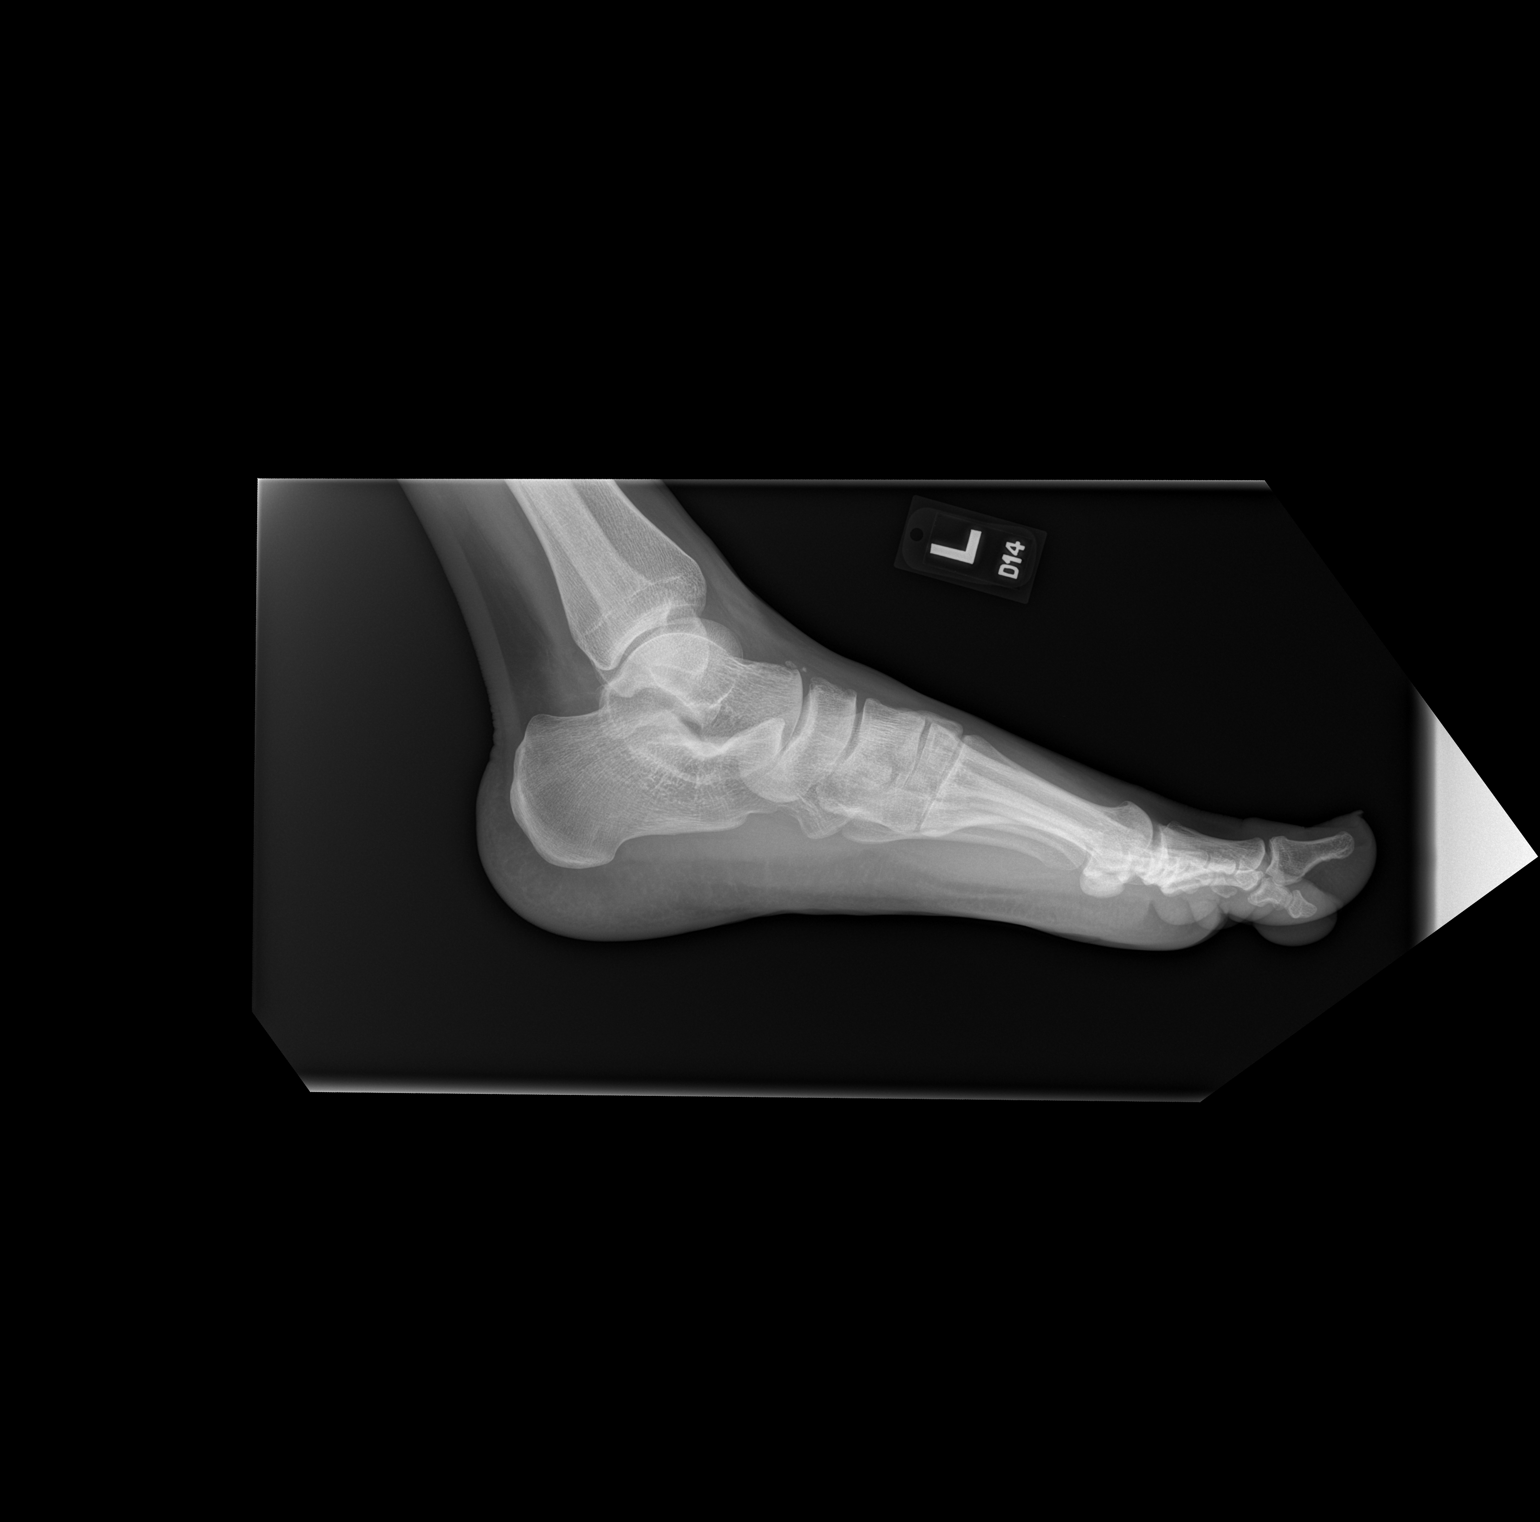

[x foot ap left]
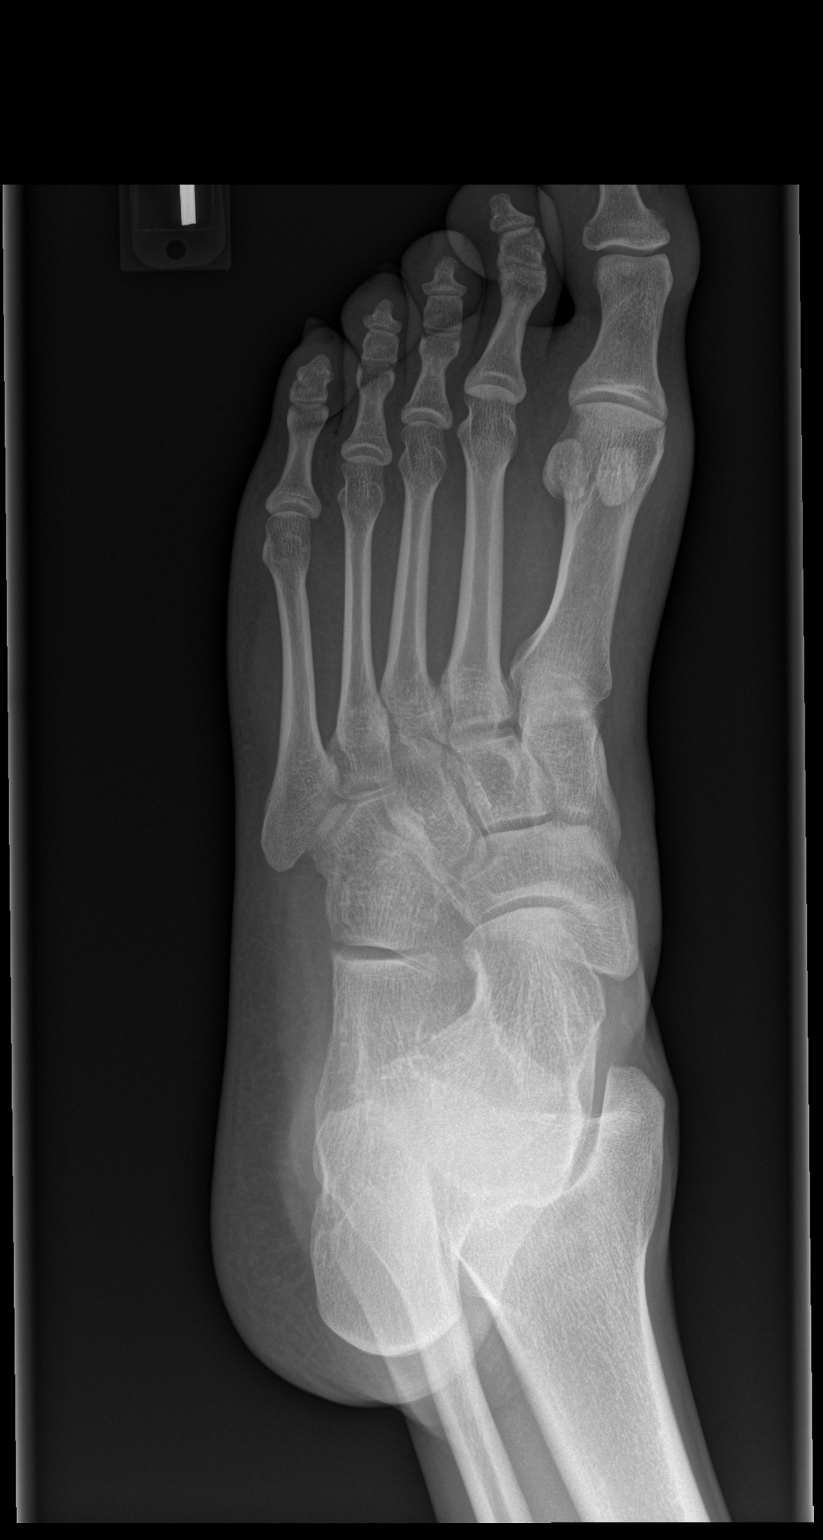

[x foot obl left]
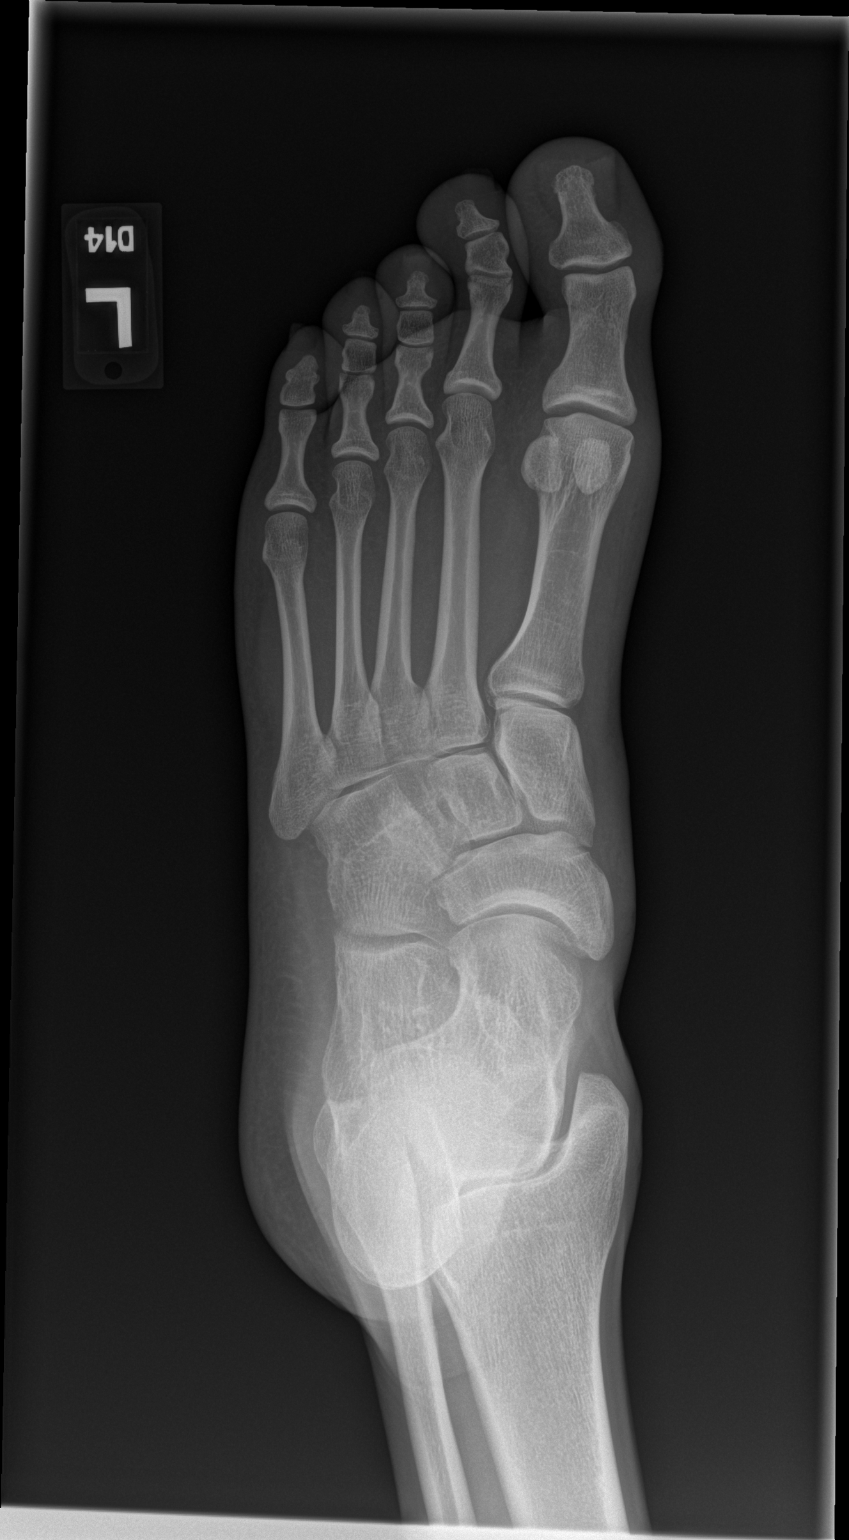

[3 of 3 positions shown; findings below may reference images not displayed]

FINDINGS: Frontal, oblique, and lateral views were obtained. No fracture or
dislocation. Small calcifications dorsal to the distal talus and
talonavicular joint may represent residua of old trauma. No soft
tissue air or radiopaque foreign body. No joint space narrowing or
erosion. There is pes planus.
IMPRESSION: No fracture or dislocation. Small calcifications dorsal to the
talonavicular joint and distal talus regions likely represent
residua of old trauma. No appreciable arthropathy. No soft tissue
air or radiopaque foreign body. Pes planus.

## 2021-06-24 IMAGING — DX DG ANKLE COMPLETE 3+V*L*
3 series · 3 of 3 positions shown · non-contrast
Comparison: None.

CLINICAL DATA: Pain following dog bite

EXAM:
LEFT ANKLE COMPLETE - 3+ VIEW

[x ankle ap left]
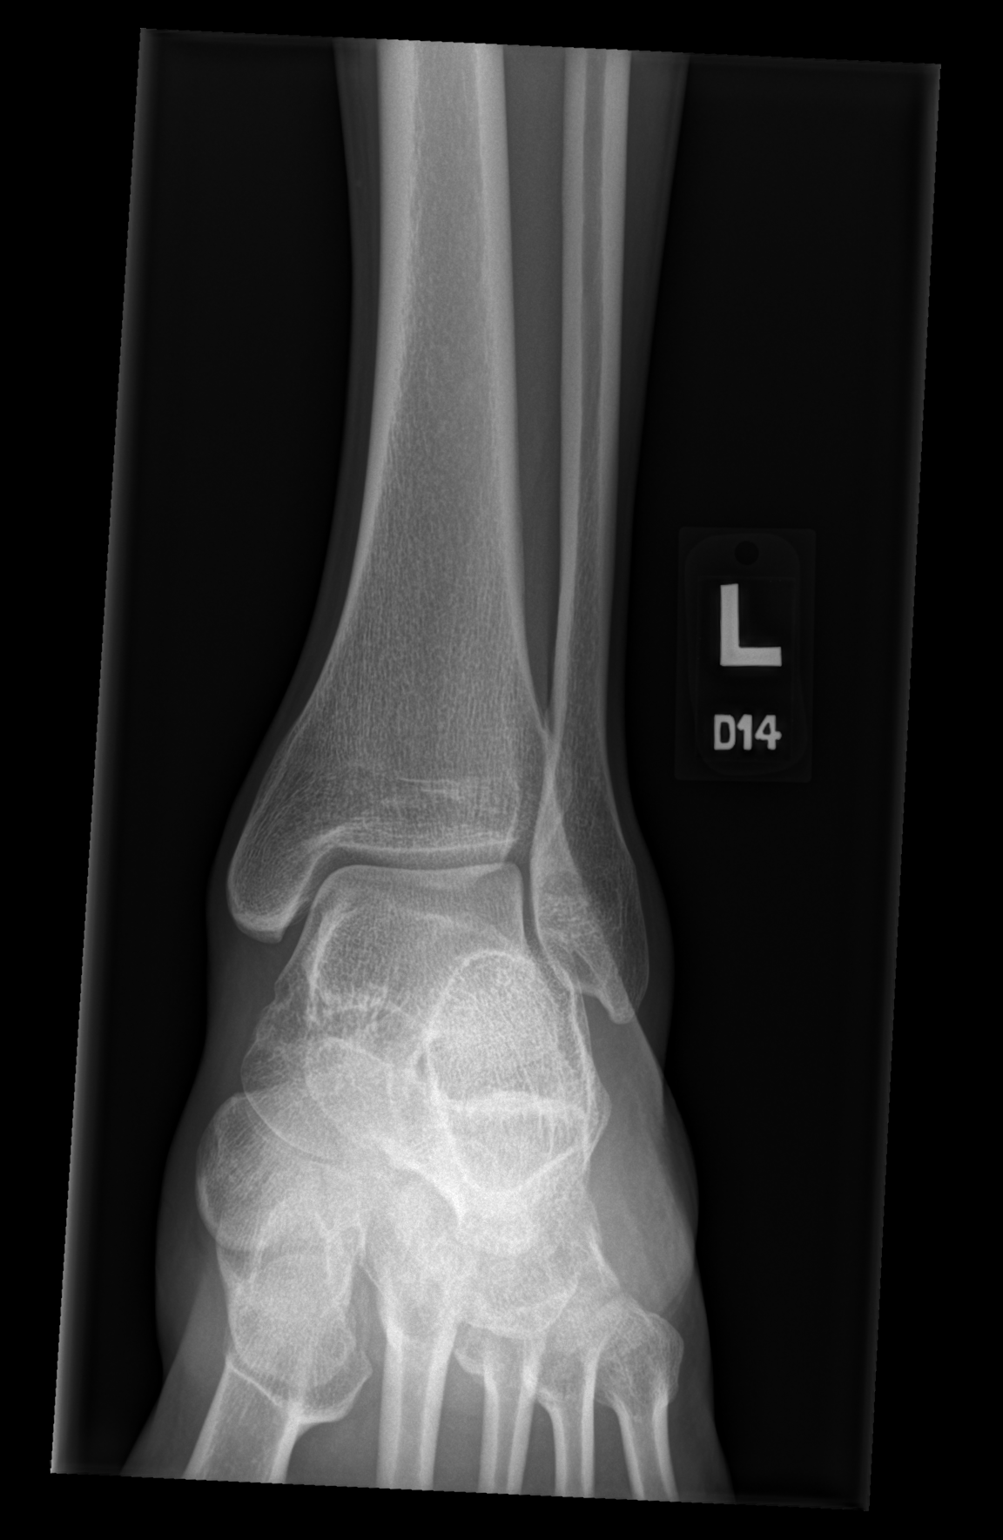

[x ankle obl left]
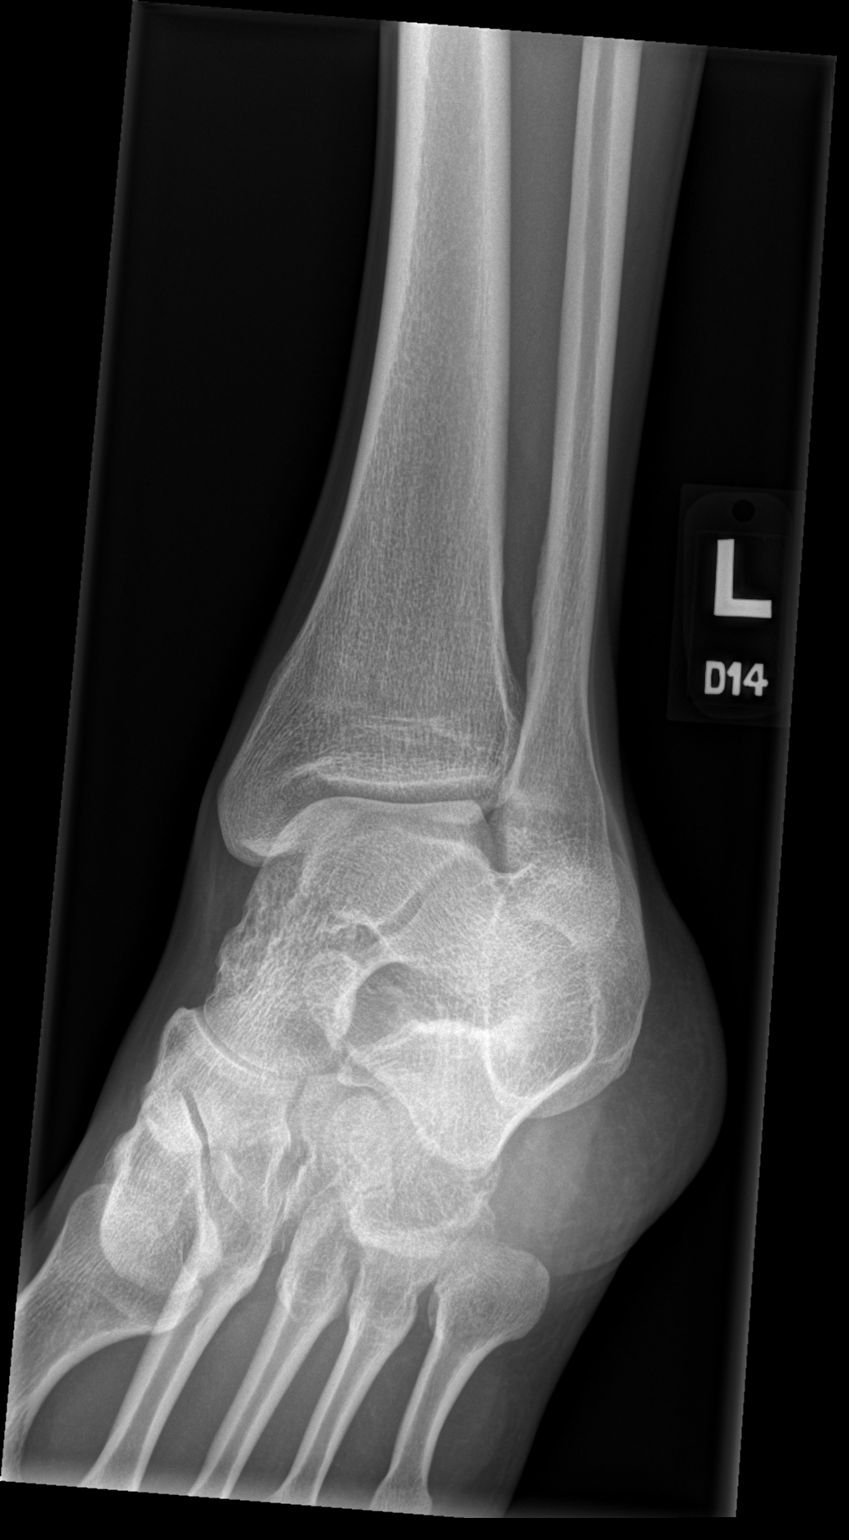

[x ankle lat left]
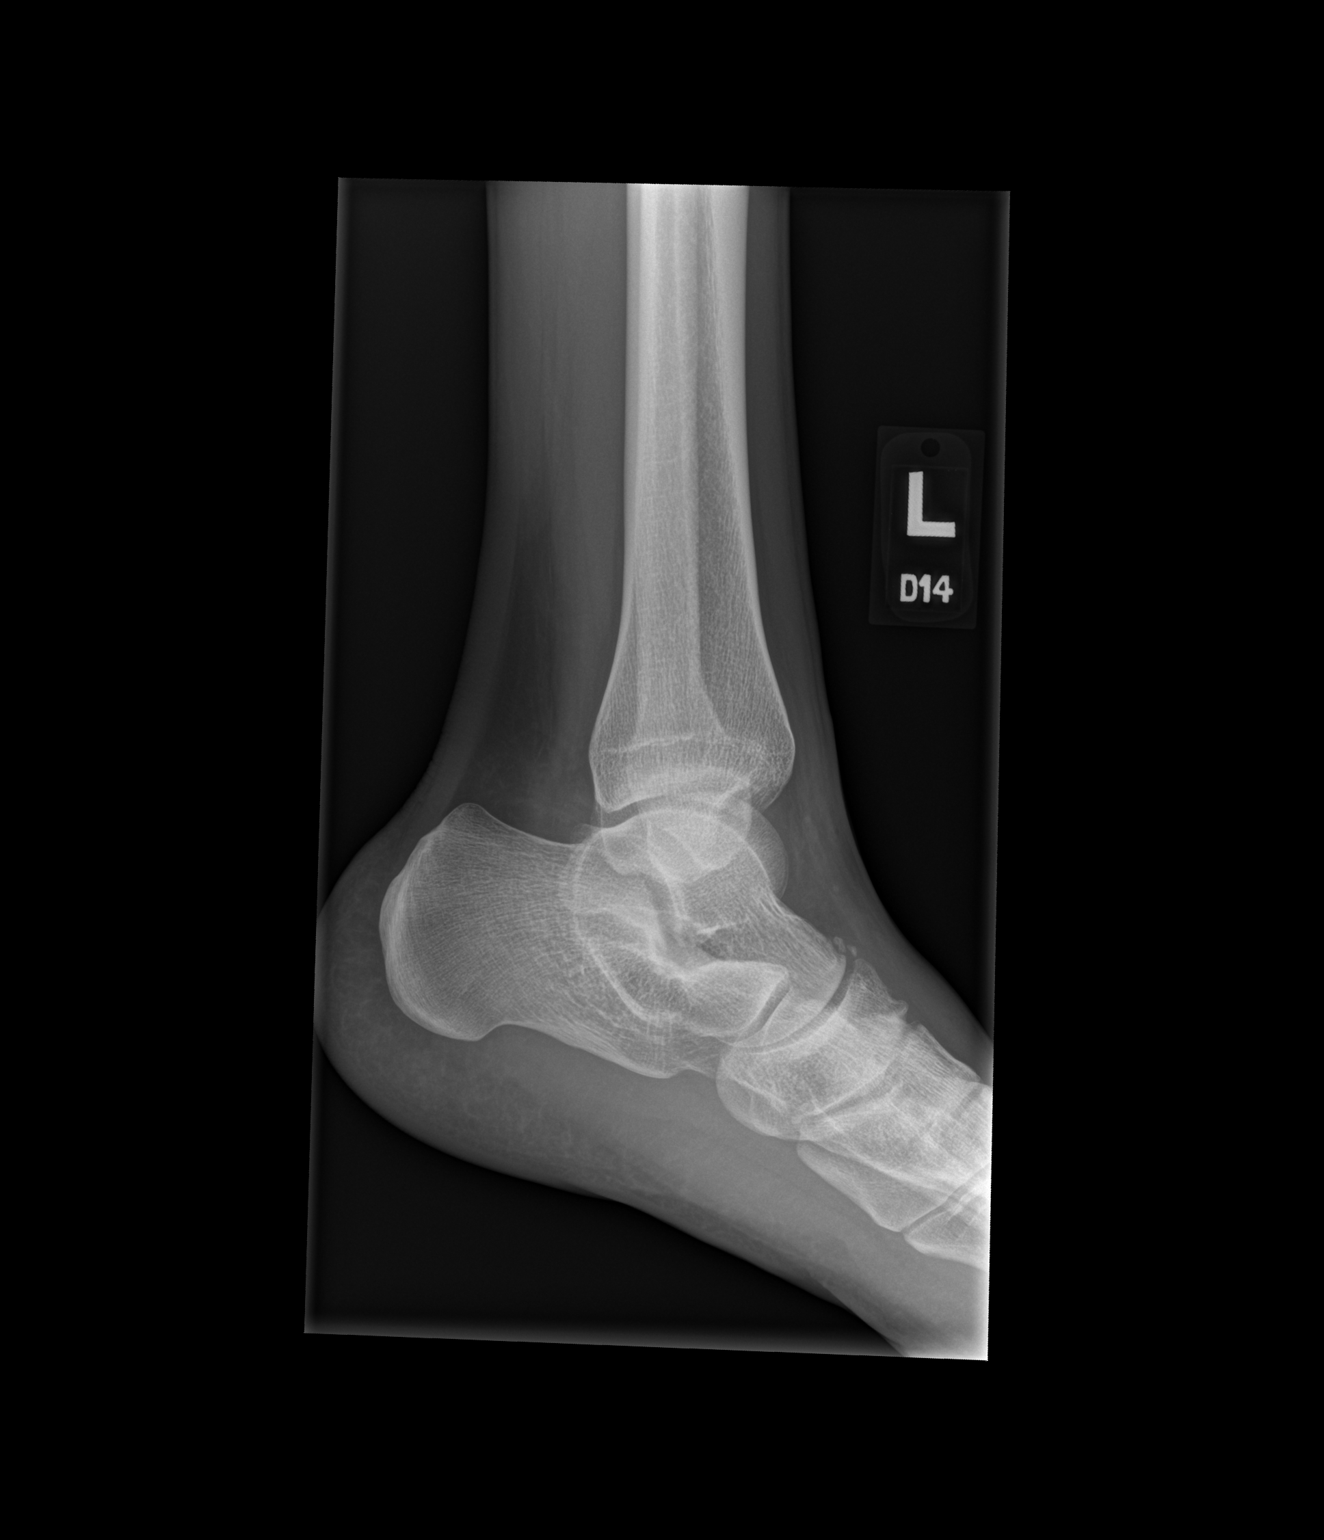

[3 of 3 positions shown; findings below may reference images not displayed]

FINDINGS: Frontal, oblique, and lateral views were obtained. There is no
evident fracture or joint effusion. No soft tissue air or radiopaque
foreign body. No joint space narrowing or erosion. Small
calcifications are noted in the dorsal talonavicular joint, likely
of arthropathic etiology or possibly representing residua of old
trauma.
IMPRESSION: No fracture or soft tissue air. No radiopaque foreign body. No
appreciable arthropathy. Ankle mortise appears intact. Question
residua of old trauma along the dorsal distal talus and
talonavicular joint.

## 2021-06-25 ENCOUNTER — Other Ambulatory Visit: Payer: Self-pay

## 2021-06-25 ENCOUNTER — Ambulatory Visit
Admission: EM | Admit: 2021-06-25 | Discharge: 2021-06-25 | Disposition: A | Discharge status: Other Acute Inpatient Hospital | Payer: Self-pay

## 2021-06-25 ENCOUNTER — Encounter: Payer: Self-pay | Admitting: Emergency Medicine

## 2021-06-25 ENCOUNTER — Ambulatory Visit (HOSPITAL_COMMUNITY)
Admission: EM | Admit: 2021-06-25 | Discharge: 2021-06-25 | Disposition: A | Discharge status: Home / Self Care | Payer: Self-pay | Attending: Physician Assistant | Admitting: Physician Assistant

## 2021-06-25 DIAGNOSIS — R030 Elevated blood-pressure reading, without diagnosis of hypertension: Secondary | ICD-10-CM

## 2021-06-25 DIAGNOSIS — L309 Dermatitis, unspecified: Secondary | ICD-10-CM

## 2021-06-25 DIAGNOSIS — R197 Diarrhea, unspecified: Secondary | ICD-10-CM

## 2021-06-25 DIAGNOSIS — R109 Unspecified abdominal pain: Secondary | ICD-10-CM

## 2021-06-25 MED ORDER — HYDROCORTISONE 1 % EX CREA: TOPICAL_CREAM | CUTANEOUS | 0 refills | Status: DC

## 2021-06-25 NOTE — Discharge Instructions (Signed)
Please go to the emergency department as soon as you leave urgent care for further evaluation and management. ?

## 2021-06-25 NOTE — ED Provider Notes (Signed)
[facility]    CSN: [id number] Arrival date & time: 06/25/21  1502      History   Chief Complaint Chief Complaint  Patient presents with   Rash    HPI Kyle Terry is a 30 y.o. male.   Patient presents today with a month-long history of pruritic rash involving his face and hands.  Reports that this began soon after he started working at his current employer where he is exposed to zinc on a regular basis.  Does report that he is wearing appropriate PPE.  He has tried hypoallergenic soaps and detergents this has not provided any relief.  He denies history of dermatological condition other than eczema when he was a child.  He denies any additional symptoms including fever, nausea, vomiting, chest pain, shortness of breath.  Patient was seen earlier at one of our other urgent cares at which point he reported severe abdominal pain.  He was instructed to go to the emergency room but reports that his pain improved soon after being evaluated and so is no longer interested in going to the ER.  He denies any current abdominal pain, nausea, vomiting.  Blood pressure is very elevated today.  Patient denies history of hypertension and is not taking any antihypertensive medications.  Denies any increase in sodium, caffeine, or use of decongestants.  He denies any chest pain, shortness of breath, headache, dizziness, vision changes.  He does not currently have a primary care provider.   No past medical history on file.  There are no problems to display for this patient.   Past Surgical History:  Procedure Laterality Date   APPENDECTOMY         Home Medications    Prior to Admission medications   Medication Sig Start Date End Date Taking? Authorizing Provider  hydrocortisone cream 1 % Apply to affected area 2 times daily 06/25/21  Yes Bethsaida Siegenthaler K, PA-C  cetirizine (ZYRTEC) 10 MG tablet Take 1 tablet (10 mg total) by mouth daily. 04/07/21   Teodora Medici, FNP   lidocaine (XYLOCAINE) 2 % solution Use as directed 15 mLs in the mouth or throat as needed for mouth pain. You may apply to affected tooth 05/04/21   Teodora Medici, FNP  oxyCODONE-acetaminophen (PERCOCET/ROXICET) 5-325 MG tablet Take 1 tablet by mouth every 6 (six) hours as needed for up to 8 doses for severe pain. 01/08/21   Luna Fuse, MD    Family History No family history on file.  Social History Social History   Tobacco Use   Smoking status: Every Day    Types: Cigarettes, Cigars    Passive exposure: Never   Smokeless tobacco: Never  Vaping Use   Vaping Use: Former  Substance Use Topics   Alcohol use: Yes    Comment: barely   Drug use: Yes    Types: Marijuana     Allergies   Patient has no known allergies.   Review of Systems Review of Systems  Constitutional:  Positive for activity change. Negative for appetite change, fatigue and fever.  Eyes:  Negative for visual disturbance.  Respiratory:  Negative for cough and shortness of breath.   Cardiovascular:  Negative for chest pain.  Gastrointestinal:  Negative for abdominal pain, diarrhea, nausea and vomiting.  Musculoskeletal:  Negative for arthralgias and myalgias.  Skin:  Positive for rash.  Neurological:  Negative for dizziness, light-headedness and headaches.    Physical Exam Triage Vital Signs ED Triage Vitals [06/25/21 1656]  Enc Vitals Group     BP (!) 179/83     Pulse Rate (!) 106     Resp 16     Temp 98.7 F (37.1 C)     Temp Source Oral     SpO2 100 %     Weight      Height      Head Circumference      Peak Flow      Pain Score      Pain Loc      Pain Edu?      Excl. in [place]?    No data found.  Updated Vital Signs BP (!) 179/83 (BP Location: Left Arm)   Pulse (!) 106   Temp 98.7 F (37.1 C) (Oral)   Resp 16   SpO2 100%   Visual Acuity Right Eye Distance:   Left Eye Distance:   Bilateral Distance:    Right Eye Near:   Left Eye Near:    Bilateral Near:     Physical  Exam Vitals reviewed.  Constitutional:      General: He is awake.     Appearance: Normal appearance. He is well-developed. He is not ill-appearing.     Comments: Very pleasant male appears stated age in no acute distress sitting comfortably in exam room  HENT:     Head: Normocephalic and atraumatic.  Cardiovascular:     Rate and Rhythm: Normal rate and regular rhythm.     Heart sounds: Normal heart sounds, S1 normal and S2 normal. No murmur heard. Pulmonary:     Effort: Pulmonary effort is normal.     Breath sounds: Normal breath sounds. No stridor. No wheezing, rhonchi or rales.     Comments: Clear to auscultation bilaterally Abdominal:     General: Bowel sounds are normal.     Palpations: Abdomen is soft.     Tenderness: There is no abdominal tenderness.  Skin:    Findings: Rash present. Rash is macular and papular.     Comments: Maculopapular rash with excoriation noted zygomatic arch bilaterally as well as interdigital spaces of hands bilaterally.  Neurological:     Mental Status: He is alert.  Psychiatric:        Behavior: Behavior is cooperative.      UC Treatments / Results  Labs (all labs ordered are listed, but only abnormal results are displayed) Labs Reviewed - No data to display  EKG   Radiology No results found.  Procedures Procedures (including critical care time)  Medications Ordered in UC Medications - No data to display  Initial Impression / Assessment and Plan / UC Course  I have reviewed the triage vital signs and the nursing notes.  Pertinent labs & imaging results that were available during my care of the patient were reviewed by me and considered in my medical decision making (see chart for details).     Suspect that rash is related to irritation from seeing.  Patient was prescribed hydrocortisone cream to be applied twice daily with instruction to wear appropriate PPE and wash his face immediately following work.  He is to several drinks of  detergents.  Discussed that if symptoms persist he should follow-up with a dermatologist.  Discussed alarm symptoms that warrant emergent evaluation.  Strict return precautions given.  Patient reports that abdominal pain is significantly improved and is now interested in going to the emergency room.  Discussed that if he has recurrent symptoms he should go to the emergency room for  evaluation including imaging as previously recommended to which he expressed understanding.  Blood pressure is very elevated today.  Patient denies history of hypertension.  Denies any signs/symptoms of endorgan damage.  He was encouraged to avoid decongestants, sodium, caffeine, NSAIDs.  Recommended he monitor his blood pressure at home and if this is persistently above 140/90 he needs to be reevaluated.  Discussed that if he develops any chest pain, shortness of breath, headache, dizziness, vision changes in the setting of high blood pressure he needs to go to the emergency room.  He was encouraged to follow-up with PCP or our clinic within a week to ensure normalization of blood pressure.  Final Clinical Impressions(s) / UC Diagnoses   Final diagnoses:  Dermatitis  Elevated blood pressure reading     Discharge Instructions      Use hydrocortisone cream.  Recommend that you wash your face immediately after work and use hypoallergenic soaps and detergents.  If your symptoms are worsening please follow-up with dermatology as we discussed.  Your blood pressure is very elevated today.  Please monitor this at home.  If it is persistently above 140/90 you need to be reevaluated.  Avoid caffeine, sodium, NSAIDs including aspirin/ibuprofen/naproxen.  If you develop chest pain, shortness of breath, vision changes, dizziness, headache in the setting of high blood pressure you need to go to the emergency room.  Follow-up with either our clinic or your primary care provider within a week to ensure normalization of your blood  pressure.  As we discussed, if you have severe abdominal pain go to the emergency room.    ED Prescriptions     Medication Sig Dispense Auth. Provider   hydrocortisone cream 1 % Apply to affected area 2 times daily 15 g Raliegh Scobie K, PA-C      PDMP not reviewed this encounter.   Terrilee Croak, PA-C 06/25/21 1743

## 2021-06-25 NOTE — ED Provider Notes (Signed)
[facility]    CSN: [id number] Arrival date & time: 06/25/21  1206      History   Chief Complaint Chief Complaint  Patient presents with   Abdominal Pain    HPI Kyle Terry is a 30 y.o. male.   Patient presents with severe right-sided abdominal pain and diarrhea that started yesterday.  Patient reports he had 2 episodes of diarrhea diarrhea last night that has now resolved.  Sudden onset of severe abdominal pain started this morning while he was working.  Abdominal pain rated 9/10 this morning that has now subsided to 6/10.  Abdominal pain is located in the right upper quadrant and is exacerbated with standing.  No alleviating factors to abdominal pain.  Denies any fevers or upper respiratory symptoms.  Patient has been recently exposed to the flu.  Has had some associated nausea but denies any vomiting.  Denies any blood in stool.  Denies chest pain or shortness of breath.   Abdominal Pain  History reviewed. No pertinent past medical history.  There are no problems to display for this patient.   Past Surgical History:  Procedure Laterality Date   APPENDECTOMY         Home Medications    Prior to Admission medications   Medication Sig Start Date End Date Taking? Authorizing Provider  cetirizine (ZYRTEC) 10 MG tablet Take 1 tablet (10 mg total) by mouth daily. 04/07/21   Teodora Medici, FNP  lidocaine (XYLOCAINE) 2 % solution Use as directed 15 mLs in the mouth or throat as needed for mouth pain. You may apply to affected tooth 05/04/21   Teodora Medici, FNP  oxyCODONE-acetaminophen (PERCOCET/ROXICET) 5-325 MG tablet Take 1 tablet by mouth every 6 (six) hours as needed for up to 8 doses for severe pain. 01/08/21   Luna Fuse, MD    Family History History reviewed. No pertinent family history.  Social History Social History   Tobacco Use   Smoking status: Every Day    Types: Cigarettes, Cigars    Passive exposure: Never   Smokeless  tobacco: Never  Vaping Use   Vaping Use: Former  Substance Use Topics   Alcohol use: Yes    Comment: barely   Drug use: Yes    Types: Marijuana     Allergies   Patient has no known allergies.   Review of Systems Review of Systems Per HPI  Physical Exam Triage Vital Signs ED Triage Vitals [06/25/21 1334]  Enc Vitals Group     BP (!) 151/104     Pulse Rate (!) 128     Resp 16     Temp 97.9 F (36.6 C)     Temp Source Oral     SpO2 98 %     Weight      Height      Head Circumference      Peak Flow      Pain Score 6     Pain Loc      Pain Edu?      Excl. in [place]?    No data found.  Updated Vital Signs BP (!) 151/104 (BP Location: Right Arm)   Pulse (!) 128   Temp 97.9 F (36.6 C) (Oral)   Resp 16   SpO2 98%   Visual Acuity Right Eye Distance:   Left Eye Distance:   Bilateral Distance:    Right Eye Near:   Left Eye Near:    Bilateral Near:  Physical Exam Constitutional:      General: He is not in acute distress.    Appearance: Normal appearance. He is ill-appearing. He is not toxic-appearing or diaphoretic.  HENT:     Head: Normocephalic and atraumatic.     Mouth/Throat:     Pharynx: No posterior oropharyngeal erythema.  Eyes:     Extraocular Movements: Extraocular movements intact.     Conjunctiva/sclera: Conjunctivae normal.  Cardiovascular:     Rate and Rhythm: Regular rhythm. Tachycardia present.     Pulses: Normal pulses.     Heart sounds: Normal heart sounds.  Pulmonary:     Effort: Pulmonary effort is normal. No respiratory distress.     Breath sounds: Normal breath sounds.  Abdominal:     General: Abdomen is flat. Bowel sounds are normal.     Palpations: Abdomen is soft.     Tenderness: There is abdominal tenderness in the right upper quadrant.  Skin:    General: Skin is warm and dry.  Neurological:     General: No focal deficit present.     Mental Status: He is alert and oriented to person, place, and time. Mental status is at  baseline.  Psychiatric:        Mood and Affect: Mood normal.        Behavior: Behavior normal.        Thought Content: Thought content normal.        Judgment: Judgment normal.     UC Treatments / Results  Labs (all labs ordered are listed, but only abnormal results are displayed) Labs Reviewed - No data to display  EKG   Radiology No results found.  Procedures Procedures (including critical care time)  Medications Ordered in UC Medications - No data to display  Initial Impression / Assessment and Plan / UC Course  I have reviewed the triage vital signs and the nursing notes.  Pertinent labs & imaging results that were available during my care of the patient were reviewed by me and considered in my medical decision making (see chart for details).     Do think that patient needs to go to the hospital for further evaluation and management as he may need imaging of the abdomen.  Vital signs are abnormal and patient is having severe abdominal pain which warrants further investigation at the hospital.  Unable to provide further imaging or evaluation at the urgent care.  Patient was agreeable with plan.  Vital signs fairly stable at discharge.  Agree with patient self transport to the hospital.  Patient verbalized understanding and was agreeable with plan. Final Clinical Impressions(s) / UC Diagnoses   Final diagnoses:  Sudden onset of severe abdominal pain  Diarrhea, unspecified type     Discharge Instructions      Please go to the emergency department as soon as you leave urgent care for further evaluation and management.    ED Prescriptions   None    PDMP not reviewed this encounter.   Teodora Medici, [place] 06/25/21 [phone]

## 2021-06-25 NOTE — ED Triage Notes (Signed)
Pt presents to the office for rash on face x 1 month.

## 2021-06-25 NOTE — ED Triage Notes (Addendum)
Diarrhea beginning yesterday. Reports abdominal pain starting this morning with nausea, walking increases/worsens pain, rest/bending over relieves it. Reports the pain as being in the center pain of the stomach, sharp/aching in nature, intermittent. Hx of appendectomy. Denies urinary problems, fever

## 2021-06-25 NOTE — Discharge Instructions (Addendum)
Use hydrocortisone cream.  Recommend that you wash your face immediately after work and use hypoallergenic soaps and detergents.  If your symptoms are worsening please follow-up with dermatology as we discussed.  Your blood pressure is very elevated today.  Please monitor this at home.  If it is persistently above 140/90 you need to be reevaluated.  Avoid caffeine, sodium, NSAIDs including aspirin/ibuprofen/naproxen.  If you develop chest pain, shortness of breath, vision changes, dizziness, headache in the setting of high blood pressure you need to go to the emergency room.  Follow-up with either our clinic or your primary care provider within a week to ensure normalization of your blood pressure.  As we discussed, if you have severe abdominal pain go to the emergency room.

## 2021-09-08 ENCOUNTER — Ambulatory Visit (HOSPITAL_COMMUNITY)
Admission: EM | Admit: 2021-09-08 | Discharge: 2021-09-08 | Disposition: A | Discharge status: Other Acute Inpatient Hospital | Payer: Self-pay

## 2021-09-08 ENCOUNTER — Encounter (HOSPITAL_COMMUNITY): Payer: Self-pay | Admitting: Emergency Medicine

## 2021-09-08 ENCOUNTER — Other Ambulatory Visit: Payer: Self-pay

## 2021-09-08 ENCOUNTER — Emergency Department (HOSPITAL_COMMUNITY)
Admission: EM | Admit: 2021-09-08 | Discharge: 2021-09-08 | Disposition: A | Discharge status: Home / Self Care | Payer: Self-pay | Attending: Emergency Medicine | Admitting: Emergency Medicine

## 2021-09-08 DIAGNOSIS — W208XXA Other cause of strike by thrown, projected or falling object, initial encounter: Secondary | ICD-10-CM | POA: Insufficient documentation

## 2021-09-08 DIAGNOSIS — S0990XA Unspecified injury of head, initial encounter: Secondary | ICD-10-CM | POA: Insufficient documentation
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
## 2021-09-08 NOTE — ED Provider Notes (Signed)
[facility] Provider Note   CSN: [id number] Arrival date & time: 09/08/21  1104     History  No chief complaint on file.   Kyle Terry is a 31 y.o. male.  The history is provided by the patient. No language interpreter was used.   31 year old male sent here from urgent care center for evaluation of head injury.  Patient report yesterday he was helping his friend move, he felt something struck his head and noted that it was told from a shelf above his head at that tumble down and struck top of his head.  He denies falling to the ground or any loss of consciousness.  He did complain of some sharp discomfort to the top of his head that has been waxing and waning mild at this time.  He also reports occasionally felt like he spaced out which is new for him.  He rates his pain a 6 out of 10.  He does not complain of any loss of vision, any significant neck pain, nausea, vomiting, or confusion.  He denies any focal numbness or focal weakness.  He went to urgent care center for his complaint but was sent here.  He did try applying ice to the affected area with some improvement.  Home Medications Prior to Admission medications   Medication Sig Start Date End Date Taking? Authorizing Provider  cetirizine (ZYRTEC) 10 MG tablet Take 1 tablet (10 mg total) by mouth daily. 04/07/21   Teodora Medici, FNP  hydrocortisone cream 1 % Apply to affected area 2 times daily 06/25/21   Raspet, Erin K, PA-C  lidocaine (XYLOCAINE) 2 % solution Use as directed 15 mLs in the mouth or throat as needed for mouth pain. You may apply to affected tooth 05/04/21   Teodora Medici, FNP  oxyCODONE-acetaminophen (PERCOCET/ROXICET) 5-325 MG tablet Take 1 tablet by mouth every 6 (six) hours as needed for up to 8 doses for severe pain. 01/08/21   Luna Fuse, MD      Allergies    Patient has no known allergies.    Review of Systems   Review of Systems  All other systems  reviewed and are negative.  Physical Exam Updated Vital Signs BP 122/76    Pulse 76    Temp 97.7 F (36.5 C) (Oral)    Resp 14    SpO2 100%  Physical Exam Vitals and nursing note reviewed.  Constitutional:      General: He is not in acute distress.    Appearance: He is well-developed.  HENT:     Head: Normocephalic and atraumatic.     Comments: Very mild tenderness to the vertex of scalp without any swelling laceration or bruising noted. Eyes:     Extraocular Movements: Extraocular movements intact.     Conjunctiva/sclera: Conjunctivae normal.     Pupils: Pupils are equal, round, and reactive to light.  Musculoskeletal:     Cervical back: Normal range of motion and neck supple. No tenderness.     Comments: 5/5 strength to all 4 extremities  Skin:    Findings: No rash.  Neurological:     Mental Status: He is alert and oriented to person, place, and time.     GCS: GCS eye subscore is 4. GCS verbal subscore is 5. GCS motor subscore is 6.     Cranial Nerves: Cranial nerves 2-12 are intact.     Sensory: Sensation is intact.     Motor: Motor  function is intact.     Coordination: Coordination is intact.    ED Results / Procedures / Treatments   Labs (all labs ordered are listed, but only abnormal results are displayed) Labs Reviewed - No data to display  EKG None  Radiology No results found.  Procedures Procedures    Medications Ordered in ED Medications - No data to display  ED Course/ Medical Decision Making/ A&P                           Medical Decision Making  BP 122/76    Pulse 76    Temp 97.7 F (36.5 C) (Oral)    Resp 14    SpO2 100%   11:31 AM Patient here with minor head injury.  He is mentating appropriately has no focal neurodeficit, head exam unremarkable.  He is able to provide history appropriately.  At this time I felt patient is of low risk for any significant intra cranial injury or skull fracture.  I discussed with patient and through shared  decision making we felt advanced imaging is not indicated at this time.  He may have some mild concussive symptoms therefore I discussed different precaution to decrease risks of concussion complication.  Otherwise I felt patient is stable for discharge.  Differential diagnosis including but not limited to skull fracture, intracranial head bleed, epidural hematoma, subdural hemorrhage, scalp contusion        Final Clinical Impression(s) / ED Diagnoses Final diagnoses:  Minor head injury without loss of consciousness, initial encounter    Rx / DC Orders ED Discharge Orders     None         Domenic Moras, PA-C 09/08/21 Sevier, Ankit, MD 09/09/21 [phone]

## 2021-09-08 NOTE — Discharge Instructions (Signed)
You have been evaluated for head injury.  You may have some symptoms suggestive of mild concussion.  Please avoid any activity that can cause head injury, get plenty of rest and return if you have any concern.
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
## 2021-11-30 ENCOUNTER — Ambulatory Visit
Admission: EM | Admit: 2021-11-30 | Discharge: 2021-11-30 | Disposition: A | Discharge status: Home / Self Care | Payer: BC Managed Care – PPO | Attending: Emergency Medicine | Admitting: Emergency Medicine

## 2021-11-30 DIAGNOSIS — L500 Allergic urticaria: Secondary | ICD-10-CM

## 2021-11-30 DIAGNOSIS — L700 Acne vulgaris: Secondary | ICD-10-CM | POA: Diagnosis not present

## 2021-11-30 DIAGNOSIS — L2082 Flexural eczema: Secondary | ICD-10-CM

## 2021-11-30 DIAGNOSIS — L209 Atopic dermatitis, unspecified: Secondary | ICD-10-CM | POA: Diagnosis not present

## 2021-11-30 MED ORDER — TRIAMCINOLONE ACETONIDE 0.025 % EX CREA: 1.0000 "application " | TOPICAL_CREAM | Freq: Two times a day (BID) | CUTANEOUS | 0 refills | Status: DC

## 2021-11-30 MED ORDER — TRIAMCINOLONE ACETONIDE 0.1 % EX CREA: TOPICAL_CREAM | CUTANEOUS | 2 refills | Status: DC

## 2021-11-30 MED ORDER — DAPSONE 5 % EX GEL: 1.0000 "application " | Freq: Two times a day (BID) | CUTANEOUS | 2 refills | Status: DC

## 2021-11-30 MED ORDER — DOXYCYCLINE MONOHYDRATE 100 MG PO TABS: 100.0000 mg | ORAL_TABLET | Freq: Two times a day (BID) | ORAL | 1 refills | Status: DC

## 2021-11-30 MED ORDER — CETIRIZINE HCL 10 MG PO TABS: 10.0000 mg | ORAL_TABLET | Freq: Every day | ORAL | 1 refills | Status: DC

## 2021-11-30 MED ORDER — MONTELUKAST SODIUM 10 MG PO TABS: 10.0000 mg | ORAL_TABLET | Freq: Every day | ORAL | 1 refills | Status: DC

## 2021-11-30 NOTE — ED Triage Notes (Signed)
Pt states he does have a rash to his face and neck due to his work environment, he states he has been doing yard work.  Started: flared up yesterday

## 2021-11-30 NOTE — Discharge Instructions (Signed)
For your acne, please begin doxycycline 1 tablet twice daily and apply dapsone gel to affected areas of the face twice daily.  Please begin using either Cetaphil or CeraVe facial cleanser and moisturizer twice daily as well.  For your eczema, please apply triamcinolone 1% cream to affected areas twice daily as needed.  Discontinue use once eczema has resolved.  Resume use if it returns.  For your dermatitis, please begin Zyrtec 1 tablet nightly at bedtime.  Please begin Singulair 1 tablet at bedtime as well.  Please consider switching your laundry detergent to 1 that is hypoallergenic meaning that it contains no added dyes or perfumes.  Please reach out to your previous dermatologist to see if you can get an appointment.  I provided you with no prescriptions of all these medications to last you several months in case it takes a while to get an appointment.  Thank you for visiting urgent care today.

## 2021-11-30 NOTE — ED Provider Notes (Signed)
[facility]    CSN: [id number] Arrival date & time: 11/30/21  1624    HISTORY   Chief Complaint  Patient presents with   Rash   HPI Kyle Terry is a 31 y.o. male. Patient presents urgent care today complaining of recurrent rash around his face and neck due to his work environment.  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  Patient states he is provided with the special suit to wear that is disposable while using chemicals.  Patient also reports a history of acne, patient states he was previously prescribed doxycycline 2 creams that he cannot recall the name of which significantly cleared up his skin.  Patient further complains of eczema on his arms close to his elbows which he states he has had his whole life off and on, currently does not have any topical steroids to use..  The history is provided by the patient.  History reviewed. No pertinent past medical history. There are no problems to display for this patient.  Past Surgical History:  Procedure Laterality Date   APPENDECTOMY      Home Medications    Prior to Admission medications   Medication Sig Start Date End Date Taking? Authorizing Provider  cetirizine (ZYRTEC ALLERGY) 10 MG tablet Take 1 tablet (10 mg total) by mouth at bedtime. 11/30/21 05/29/22 Yes Lynden Oxford Scales, PA-C  Dapsone 5 % topical gel Apply 1 application. topically 2 (two) times daily. 11/30/21  Yes Lynden Oxford Scales, PA-C  doxycycline (ADOXA) 100 MG tablet Take 1 tablet (100 mg total) by mouth 2 (two) times daily. 11/30/21 05/29/22 Yes Lynden Oxford Scales, PA-C  montelukast (SINGULAIR) 10 MG tablet Take 1 tablet (10 mg total) by mouth at bedtime. 11/30/21 05/29/22 Yes Lynden Oxford Scales, PA-C  triamcinolone (KENALOG) 0.025 % cream Apply 1 application. topically 2 (two) times daily. Apply to affected areas of face and neck twice daily as needed for rash. 11/30/21   Yes Lynden Oxford Scales, PA-C  triamcinolone cream (KENALOG) 0.1 % Apply twice daily as needed to affected areas of the skin on the body and not the face or neck. 11/30/21  Yes Lynden Oxford Scales, PA-C    Family History History reviewed. No pertinent family history. Social History Social History   Tobacco Use   Smoking status: Every Day    Types: Cigarettes, Cigars    Passive exposure: Never   Smokeless tobacco: Never  Vaping Use   Vaping Use: Former  Substance Use Topics   Alcohol use: Not Currently    Comment: barely   Drug use: Not Currently    Types: Marijuana   Allergies   Patient has no known allergies.  Review of Systems Review of Systems Pertinent findings noted in history of present illness.   Physical Exam Triage Vital Signs ED Triage Vitals  Enc Vitals Group     BP 05/11/21 0827 (!) 147/82     Pulse Rate 05/11/21 0827 72     Resp 05/11/21 0827 18     Temp 05/11/21 0827 98.3 F (36.8 C)     Temp Source 05/11/21 0827 Oral     SpO2 05/11/21 0827 98 %     Weight --      Height --      Head Circumference --      Peak Flow --      Pain Score 05/11/21 0826 5     Pain Loc --  Pain Edu? --      Excl. in [place]? --   No data found.  Updated Vital Signs BP 113/77 (BP Location: Right Arm)   Pulse 82   Temp 98.5 F (36.9 C) (Oral)   Resp 18   SpO2 98%   Physical Exam Vitals and nursing note reviewed.  Constitutional:      General: He is not in acute distress.    Appearance: Normal appearance. He is not ill-appearing.  HENT:     Head: Normocephalic and atraumatic.     Salivary Glands: Right salivary gland is not diffusely enlarged or tender. Left salivary gland is not diffusely enlarged or tender.     Right Ear: Ear canal and external ear normal. No drainage. A middle ear effusion is present. There is no impacted cerumen. Tympanic membrane is bulging. Tympanic membrane is not injected or erythematous.     Left Ear: Ear canal and external ear  normal. No drainage. A middle ear effusion is present. There is no impacted cerumen. Tympanic membrane is bulging. Tympanic membrane is not injected or erythematous.     Ears:     Comments: Bilateral EACs normal, both TMs bulging with clear fluid    Nose: Rhinorrhea present. No nasal deformity, septal deviation, signs of injury, nasal tenderness, mucosal edema or congestion. Rhinorrhea is clear.     Right Nostril: Occlusion present. No foreign body, epistaxis or septal hematoma.     Left Nostril: Occlusion present. No foreign body, epistaxis or septal hematoma.     Right Turbinates: Enlarged, swollen and pale.     Left Turbinates: Enlarged, swollen and pale.     Right Sinus: No maxillary sinus tenderness or frontal sinus tenderness.     Left Sinus: No maxillary sinus tenderness or frontal sinus tenderness.     Mouth/Throat:     Lips: Pink. No lesions.     Mouth: Mucous membranes are moist. No oral lesions.     Pharynx: Oropharynx is clear. Uvula midline. No posterior oropharyngeal erythema or uvula swelling.     Tonsils: No tonsillar exudate. 0 on the right. 0 on the left.     Comments: Postnasal drip Eyes:     General: Lids are normal.        Right eye: No discharge.        Left eye: No discharge.     Extraocular Movements: Extraocular movements intact.     Conjunctiva/sclera: Conjunctivae normal.     Right eye: Right conjunctiva is not injected.     Left eye: Left conjunctiva is not injected.  Neck:     Trachea: Trachea and phonation normal.  Cardiovascular:     Rate and Rhythm: Normal rate and regular rhythm.     Pulses: Normal pulses.     Heart sounds: Normal heart sounds. No murmur heard.   No friction rub. No gallop.  Pulmonary:     Effort: Pulmonary effort is normal. No accessory muscle usage, prolonged expiration or respiratory distress.     Breath sounds: Normal breath sounds. No stridor, decreased air movement or transmitted upper airway sounds. No decreased breath sounds,  wheezing, rhonchi or rales.  Chest:     Chest wall: No tenderness.  Musculoskeletal:        General: Normal range of motion.     Cervical back: Normal range of motion and neck supple. Normal range of motion.  Lymphadenopathy:     Cervical: No cervical adenopathy.  Skin:    General: Skin is warm  and dry.     Findings: Rash (See photos below) present. No erythema.     Comments: Mild acne vulgaris scattered on both cheeks  Neurological:     General: No focal deficit present.     Mental Status: He is alert and oriented to person, place, and time.  Psychiatric:        Mood and Affect: Mood normal.        Behavior: Behavior normal.         Visual Acuity Right Eye Distance:   Left Eye Distance:   Bilateral Distance:    Right Eye Near:   Left Eye Near:    Bilateral Near:     UC Couse / Diagnostics / Procedures:    EKG  Radiology No results found.  Procedures Procedures (including critical care time)  UC Diagnoses / Final Clinical Impressions(s)   I have reviewed the triage vital signs and the nursing notes.  Pertinent labs & imaging results that were available during my care of the patient were reviewed by me and considered in my medical decision making (see chart for details).    Final diagnoses:  Acne vulgaris  Flexural eczema  Allergic urticaria  Atopic dermatitis, unspecified type   Patient provided with doxycycline and dapsone gel for his acne.  Patient provided with Zyrtec and Singulair for his atopic dermatitis.  Patient provided with triamcinolone for his eczema.  Return precautions advised.  Patient advised to follow-up with his previous dermatologist.  ED Prescriptions     Medication Sig Dispense Auth. Provider   Dapsone 5 % topical gel Apply 1 application. topically 2 (two) times daily. 90 g Lynden Oxford Scales, PA-C   doxycycline (ADOXA) 100 MG tablet Take 1 tablet (100 mg total) by mouth 2 (two) times daily. 180 tablet Lynden Oxford Scales, PA-C    triamcinolone cream (KENALOG) 0.1 % Apply twice daily as needed to affected areas of the skin on the body and not the face or neck. 80 g Lynden Oxford Scales, PA-C   montelukast (SINGULAIR) 10 MG tablet Take 1 tablet (10 mg total) by mouth at bedtime. 90 tablet Lynden Oxford Scales, PA-C   cetirizine (ZYRTEC ALLERGY) 10 MG tablet Take 1 tablet (10 mg total) by mouth at bedtime. 90 tablet Lynden Oxford Scales, PA-C   triamcinolone (KENALOG) 0.025 % cream Apply 1 application. topically 2 (two) times daily. Apply to affected areas of face and neck twice daily as needed for rash. 30 g Lynden Oxford Scales, PA-C      PDMP not reviewed this encounter.  Pending results:  Labs Reviewed - No data to display  Medications Ordered in UC: Medications - No data to display  Disposition Upon Discharge:  Condition: stable for discharge home Home: take medications as prescribed; routine discharge instructions as discussed; follow up as advised.  Patient presented with an acute illness with associated systemic symptoms and significant discomfort requiring urgent management. In my opinion, this is a condition that a prudent lay person (someone who possesses an average knowledge of health and medicine) may potentially expect to result in complications if not addressed urgently such as respiratory distress, impairment of bodily function or dysfunction of bodily organs.   Routine symptom specific, illness specific and/or disease specific instructions were discussed with the patient and/or caregiver at length.   As such, the patient has been evaluated and assessed, work-up was performed and treatment was provided in alignment with urgent care protocols and evidence based medicine.  Patient/parent/caregiver has been advised  that the patient may require follow up for further testing and treatment if the symptoms continue in spite of treatment, as clinically indicated and  appropriate.  Patient/parent/caregiver has been advised to return to the [facility] or PCP if no better; to PCP or the Emergency Department if new signs and symptoms develop, or if the current signs or symptoms continue to change or worsen for further workup, evaluation and treatment as clinically indicated and appropriate  The patient will follow up with their current PCP if and as advised. If the patient does not currently have a PCP we will assist them in obtaining one.   The patient may need specialty follow up if the symptoms continue, in spite of conservative treatment and management, for further workup, evaluation, consultation and treatment as clinically indicated and appropriate.   Patient/parent/caregiver verbalized understanding and agreement of plan as discussed.  All questions were addressed during visit.  Please see discharge instructions below for further details of plan.  Discharge Instructions:   Discharge Instructions      For your acne, please begin doxycycline 1 tablet twice daily and apply dapsone gel to affected areas of the face twice daily.  Please begin using either Cetaphil or CeraVe facial cleanser and moisturizer twice daily as well.  For your eczema, please apply triamcinolone 1% cream to affected areas twice daily as needed.  Discontinue use once eczema has resolved.  Resume use if it returns.  For your dermatitis, please begin Zyrtec 1 tablet nightly at bedtime.  Please begin Singulair 1 tablet at bedtime as well.  Please consider switching your laundry detergent to 1 that is hypoallergenic meaning that it contains no added dyes or perfumes.  Please reach out to your previous dermatologist to see if you can get an appointment.  I provided you with no prescriptions of all these medications to last you several months in case it takes a while to get an appointment.  Thank you for visiting urgent care today.    This office note has been dictated using [employer].  Unfortunately, and despite my best efforts, this method of dictation can sometimes lead to occasional typographical or grammatical errors.  I apologize in advance if this occurs.     Lynden Oxford Scales, PA-C 11/30/21 1906

## 2022-01-04 ENCOUNTER — Ambulatory Visit
Admission: EM | Admit: 2022-01-04 | Discharge: 2022-01-04 | Disposition: A | Discharge status: Home / Self Care | Payer: BC Managed Care – PPO | Attending: Urgent Care | Admitting: Urgent Care

## 2022-01-04 ENCOUNTER — Encounter: Payer: Self-pay | Admitting: Urgent Care

## 2022-01-04 DIAGNOSIS — L03213 Periorbital cellulitis: Secondary | ICD-10-CM

## 2022-01-04 MED ORDER — ERYTHROMYCIN 5 MG/GM OP OINT: TOPICAL_OINTMENT | OPHTHALMIC | 0 refills | Status: DC

## 2022-01-04 MED ORDER — AMOXICILLIN-POT CLAVULANATE 875-125 MG PO TABS: 1.0000 | ORAL_TABLET | Freq: Two times a day (BID) | ORAL | 0 refills | Status: AC

## 2022-01-04 MED ORDER — AMOXICILLIN-POT CLAVULANATE 875-125 MG PO TABS: 1.0000 | ORAL_TABLET | Freq: Two times a day (BID) | ORAL | 0 refills | Status: DC

## 2022-01-04 NOTE — ED Provider Notes (Signed)
[facility]    CSN: [id number] Arrival date & time: 01/04/22  1442      History   Chief Complaint Chief Complaint  Patient presents with   Eye Pain    HPI Hoan Rough is a 31 y.o. male.   Pleasant 31 year old male presents today with complaint of left-sided eye discomfort.  He states that on Sunday, 6 days ago, he was holding a beverage cup from sheets, and bent his head down hitting the plastic straw on his upper medial eyelid.  He states he thought he scratched his eye, but reports that the pain seems to be in the eyelid itself.  He had been using over-the-counter drops and felt that symptoms improved until 2 days ago.  He notes that the eyelid is red and swollen compared to the other side.  He denies any photophobia, blurred vision, or headache.  No additional URI symptoms.   Eye Pain   History reviewed. No pertinent past medical history.  There are no problems to display for this patient.   Past Surgical History:  Procedure Laterality Date   APPENDECTOMY         Home Medications    Prior to Admission medications   Medication Sig Start Date End Date Taking? Authorizing Provider  amoxicillin-clavulanate (AUGMENTIN) 875-125 MG tablet Take 1 tablet by mouth every 12 (twelve) hours for 5 days. 01/04/22 01/09/22  Guy Sandifer L, PA  cetirizine (ZYRTEC ALLERGY) 10 MG tablet Take 1 tablet (10 mg total) by mouth at bedtime. 11/30/21 05/29/22  Theadora Rama Scales, PA-C  Dapsone 5 % topical gel Apply 1 application. topically 2 (two) times daily. 11/30/21   Theadora Rama Scales, PA-C  erythromycin ophthalmic ointment Place a 1/2 inch ribbon of ointment onto upper L eyelid three times daily x 5 days 01/04/22   Verlon Setting, Whitney L, PA  montelukast (SINGULAIR) 10 MG tablet Take 1 tablet (10 mg total) by mouth at bedtime. 11/30/21 05/29/22  Theadora Rama Scales, PA-C  triamcinolone (KENALOG) 0.025 % cream Apply 1 application. topically 2 (two) times daily.  Apply to affected areas of face and neck twice daily as needed for rash. 11/30/21   Theadora Rama Scales, PA-C  triamcinolone cream (KENALOG) 0.1 % Apply twice daily as needed to affected areas of the skin on the body and not the face or neck. 11/30/21   Theadora Rama Scales, PA-C    Family History History reviewed. No pertinent family history.  Social History Social History   Tobacco Use   Smoking status: Every Day    Types: Cigarettes, Cigars    Passive exposure: Never   Smokeless tobacco: Never  Vaping Use   Vaping Use: Former  Substance Use Topics   Alcohol use: Not Currently    Comment: barely   Drug use: Not Currently    Types: Marijuana     Allergies   Patient has no known allergies.   Review of Systems Review of Systems  Eyes:  Positive for pain.     Physical Exam Triage Vital Signs ED Triage Vitals  Enc Vitals Group     BP 01/04/22 1516 130/86     Pulse Rate 01/04/22 1516 75     Resp 01/04/22 1516 18     Temp 01/04/22 1516 (!) 97.4 F (36.3 C)     Temp Source 01/04/22 1516 Oral     SpO2 01/04/22 1516 98 %     Weight --      Height --  Head Circumference --      Peak Flow --      Pain Score 01/04/22 1515 10     Pain Loc --      Pain Edu? --      Excl. in [place]? --    No data found.  Updated Vital Signs BP 130/86   Pulse 75   Temp (!) 97.4 F (36.3 C) (Oral)   Resp 18   SpO2 98%   Visual Acuity Right Eye Distance: 20/20 Left Eye Distance: 20/20 Bilateral Distance: 20/20  Right Eye Near:   Left Eye Near:    Bilateral Near:     Physical Exam Vitals and nursing note reviewed.  Constitutional:      General: He is not in acute distress.    Appearance: He is well-developed.  HENT:     Head: Normocephalic and atraumatic.  Eyes:     General: Lids are everted, no foreign bodies appreciated. Vision grossly intact. Gaze aligned appropriately. No allergic shiner, visual field deficit or scleral icterus.       Right eye: No foreign body,  discharge or hordeolum.        Left eye: No foreign body, discharge or hordeolum.     Extraocular Movements: Extraocular movements intact.     Right eye: Normal extraocular motion and no nystagmus.     Left eye: Normal extraocular motion and no nystagmus.     Conjunctiva/sclera: Conjunctivae normal.     Right eye: Right conjunctiva is not injected. No chemosis, exudate or hemorrhage.    Left eye: Left conjunctiva is not injected. No chemosis, exudate or hemorrhage.    Pupils: Pupils are equal, round, and reactive to light.     Right eye: No corneal abrasion or fluorescein uptake.     Left eye: No corneal abrasion or fluorescein uptake.     Visual Fields: Right eye visual fields normal and left eye visual fields normal.      Comments: Area of annotation red, swollen, tender  Cardiovascular:     Rate and Rhythm: Normal rate and regular rhythm.     Heart sounds: No murmur heard. Pulmonary:     Effort: Pulmonary effort is normal. No respiratory distress.     Breath sounds: Normal breath sounds.  Abdominal:     Palpations: Abdomen is soft.     Tenderness: There is no abdominal tenderness.  Musculoskeletal:        General: No swelling.     Cervical back: Neck supple.  Skin:    General: Skin is warm and dry.     Capillary Refill: Capillary refill takes less than 2 seconds.  Neurological:     Mental Status: He is alert.  Psychiatric:        Mood and Affect: Mood normal.      UC Treatments / Results  Labs (all labs ordered are listed, but only abnormal results are displayed) Labs Reviewed - No data to display  EKG   Radiology No results found.  Procedures Procedures (including critical care time)  Medications Ordered in UC Medications - No data to display  Initial Impression / Assessment and Plan / UC Course  I have reviewed the triage vital signs and the nursing notes.  Pertinent labs & imaging results that were available during my care of the patient were reviewed by  me and considered in my medical decision making (see chart for details).     Preseptal cellulitis - developing to L medial upper eyelid.  Fluorescein stain normal, no evidence of abrasion. Will start 5 days of PO abx, topical emycin ointment to lid margin medially.   Final Clinical Impressions(s) / UC Diagnoses   Final diagnoses:  Preseptal cellulitis of left upper eyelid     Discharge Instructions      You have a developing infection on your eyelid, however you do not have an ulcer or an abrasion of your eye.  Please apply the topical ointment to your left upper eyelid 3 times daily.  Continue warm compresses several times daily.  Start taking the Augmentin twice daily with food for 5 days.  If you develop any worsening symptoms, pain with motion of the eyes, or fever, please had to the ER follow-up with an ophthalmologist.     ED Prescriptions     Medication Sig Dispense Auth. Provider   amoxicillin-clavulanate (AUGMENTIN) 875-125 MG tablet  (Status: Discontinued) Take 1 tablet by mouth every 12 (twelve) hours for 5 days. 10 tablet Anwitha Mapes, Whitney L, PA   erythromycin ophthalmic ointment  (Status: Discontinued) Place a 1/2 inch ribbon of ointment onto upper L eyelid three times daily x 5 days 3.5 g Graeme Menees, Whitney L, PA   amoxicillin-clavulanate (AUGMENTIN) 875-125 MG tablet Take 1 tablet by mouth every 12 (twelve) hours for 5 days. 10 tablet Christpoher Sievers, Whitney L, PA   erythromycin ophthalmic ointment Place a 1/2 inch ribbon of ointment onto upper L eyelid three times daily x 5 days 3.5 g Ivie Maese, Whitney L, PA      PDMP not reviewed this encounter.   Maretta Bees, [place] 01/04/22 2147

## 2022-01-04 NOTE — ED Triage Notes (Signed)
 Patient presents to Urgent Care with complaints of L eye damage since last Sunday. Patient reports he was drinking from a cup with a straw and hit his eye.  Pt reports pain 10/10 that comes and goes, swelling noted at eye.

## 2022-01-24 ENCOUNTER — Ambulatory Visit
Admission: EM | Admit: 2022-01-24 | Discharge: 2022-01-24 | Disposition: A | Discharge status: Home / Self Care | Payer: BC Managed Care – PPO | Attending: Family Medicine | Admitting: Family Medicine

## 2022-01-24 DIAGNOSIS — H01119 Allergic dermatitis of unspecified eye, unspecified eyelid: Secondary | ICD-10-CM | POA: Diagnosis not present

## 2022-01-24 MED ORDER — OLOPATADINE HCL 0.1 % OP SOLN: 1.0000 [drp] | Freq: Two times a day (BID) | OPHTHALMIC | 12 refills | Status: DC | PRN

## 2022-01-24 MED ORDER — PREDNISONE 20 MG PO TABS: 40.0000 mg | ORAL_TABLET | Freq: Every day | ORAL | 0 refills | Status: AC

## 2022-01-24 NOTE — ED Triage Notes (Signed)
Pt presents with recurrence of left eye swelling and discomfort.

## 2022-01-24 NOTE — ED Provider Notes (Signed)
[facility]    CSN: [id number] Arrival date & time: 01/24/22  1012      History   Chief Complaint Chief Complaint  Patient presents with   Eye Problem    HPI [facility] is a 31 y.o. male.    Eye Problem  Here for swelling of his left upper eyelid.  It has been coming and going in the last 2 or 3 weeks.  He was seen here in late June and was treated for preseptal cellulitis, when his eyelid was very red and swollen persistently.  Now it is overall improved but he will have times where he gets more swollen.  He relates that 2 days ago he was outside and feels that that caused the left upper eyelid to be very swollen and was nearly swollen shut.  It is improved today.  Not much itching.  He has occasionally seen drainage.  No fever  History reviewed. No pertinent past medical history.  There are no problems to display for this patient.   Past Surgical History:  Procedure Laterality Date   APPENDECTOMY         Home Medications    Prior to Admission medications   Medication Sig Start Date End Date Taking? Authorizing Provider  olopatadine (PATADAY) 0.1 % ophthalmic solution Place 1 drop into the left eye 2 (two) times daily as needed for allergies. 01/24/22  Yes Ilana Prezioso, Gwenlyn Perking, MD  predniSONE (DELTASONE) 20 MG tablet Take 2 tablets (40 mg total) by mouth daily with breakfast for 3 days. 01/24/22 01/27/22 Yes Amorina Doerr, Gwenlyn Perking, MD  cetirizine (ZYRTEC ALLERGY) 10 MG tablet Take 1 tablet (10 mg total) by mouth at bedtime. 11/30/21 05/29/22  Lynden Oxford Scales, PA-C  Dapsone 5 % topical gel Apply 1 application. topically 2 (two) times daily. 11/30/21   Lynden Oxford Scales, PA-C  montelukast (SINGULAIR) 10 MG tablet Take 1 tablet (10 mg total) by mouth at bedtime. 11/30/21 05/29/22  Lynden Oxford Scales, PA-C  triamcinolone (KENALOG) 0.025 % cream Apply 1 application. topically 2 (two) times daily. Apply to affected areas of face and neck twice  daily as needed for rash. 11/30/21   Lynden Oxford Scales, PA-C  triamcinolone cream (KENALOG) 0.1 % Apply twice daily as needed to affected areas of the skin on the body and not the face or neck. 11/30/21   Lynden Oxford Scales, PA-C    Family History Family History  Family history unknown: Yes    Social History Social History   Tobacco Use   Smoking status: Every Day    Types: Cigarettes, Cigars    Passive exposure: Never   Smokeless tobacco: Never  Vaping Use   Vaping Use: Former  Substance Use Topics   Alcohol use: Not Currently    Comment: barely   Drug use: Not Currently    Types: Marijuana     Allergies   Patient has no known allergies.   Review of Systems Review of Systems   Physical Exam Triage Vital Signs ED Triage Vitals  Enc Vitals Group     BP 01/24/22 1147 120/83     Pulse Rate 01/24/22 1147 65     Resp 01/24/22 1147 18     Temp 01/24/22 1147 97.6 F (36.4 C)     Temp Source 01/24/22 1147 Oral     SpO2 01/24/22 1147 97 %     Weight --      Height --      Head Circumference --  Peak Flow --      Pain Score 01/24/22 1148 0     Pain Loc --      Pain Edu? --      Excl. in [place]? --    No data found.  Updated Vital Signs BP 120/83 (BP Location: Left Arm)   Pulse 65   Temp 97.6 F (36.4 C) (Oral)   Resp 18   SpO2 97%   Visual Acuity Right Eye Distance:   Left Eye Distance:   Bilateral Distance:    Right Eye Near:   Left Eye Near:    Bilateral Near:     Physical Exam Vitals reviewed.  Constitutional:      General: He is not in acute distress.    Appearance: He is not ill-appearing or toxic-appearing.  HENT:     Nose: Nose normal.     Mouth/Throat:     Mouth: Mucous membranes are moist.  Eyes:     Extraocular Movements: Extraocular movements intact.     Conjunctiva/sclera: Conjunctivae normal.     Pupils: Pupils are equal, round, and reactive to light.     Comments: There is no injection of either eye.  The left upper lid  is very mildly swollen more so in the middle.  There is no induration.  Skin:    Coloration: Skin is not jaundiced or pale.  Neurological:     Mental Status: He is alert and oriented to person, place, and time.  Psychiatric:        Behavior: Behavior normal.      UC Treatments / Results  Labs (all labs ordered are listed, but only abnormal results are displayed) Labs Reviewed - No data to display  EKG   Radiology No results found.  Procedures Procedures (including critical care time)  Medications Ordered in UC Medications - No data to display  Initial Impression / Assessment and Plan / UC Course  I have reviewed the triage vital signs and the nursing notes.  Pertinent labs & imaging results that were available during my care of the patient were reviewed by me and considered in my medical decision making (see chart for details).     Going to treat for possible allergic symptoms.  We will use Pataday and 3 days of prednisone.  He is given contact information for ophthalmology Final Clinical Impressions(s) / UC Diagnoses   Final diagnoses:  Contact and allergic dermatitis of eyelid     Discharge Instructions      Take prednisone 20 mg--2 daily for 3 days  Put Pataday eyedrops in your left eye 2 times daily as needed for possible allergies     ED Prescriptions     Medication Sig Dispense Auth. Provider   olopatadine (PATADAY) 0.1 % ophthalmic solution Place 1 drop into the left eye 2 (two) times daily as needed for allergies. 5 mL Barrett Henle, MD   predniSONE (DELTASONE) 20 MG tablet Take 2 tablets (40 mg total) by mouth daily with breakfast for 3 days. 6 tablet Windy Carina Gwenlyn Perking, MD      PDMP not reviewed this encounter.   Barrett Henle, MD 01/24/22 [phone]

## 2022-01-24 NOTE — Discharge Instructions (Addendum)
Take prednisone 20 mg--2 daily for 3 days  Put Pataday eyedrops in your left eye 2 times daily as needed for possible allergies

## 2022-05-10 ENCOUNTER — Encounter (HOSPITAL_BASED_OUTPATIENT_CLINIC_OR_DEPARTMENT_OTHER): Payer: Self-pay

## 2022-05-10 ENCOUNTER — Emergency Department (HOSPITAL_BASED_OUTPATIENT_CLINIC_OR_DEPARTMENT_OTHER)
Admission: EM | Admit: 2022-05-10 | Discharge: 2022-05-10 | Disposition: A | Discharge status: Home / Self Care | Payer: BC Managed Care – PPO | Attending: Emergency Medicine | Admitting: Emergency Medicine

## 2022-05-10 ENCOUNTER — Emergency Department (HOSPITAL_BASED_OUTPATIENT_CLINIC_OR_DEPARTMENT_OTHER): Payer: BC Managed Care – PPO | Admitting: Radiology

## 2022-05-10 ENCOUNTER — Other Ambulatory Visit: Payer: Self-pay

## 2022-05-10 DIAGNOSIS — M25561 Pain in right knee: Secondary | ICD-10-CM | POA: Diagnosis present

## 2022-05-10 DIAGNOSIS — M222X1 Patellofemoral disorders, right knee: Secondary | ICD-10-CM | POA: Diagnosis not present

## 2022-05-10 NOTE — Discharge Instructions (Signed)
For your pain, you may take up to 1000mg  of acetaminophen (tylenol) 4 times daily for up to a week. This is the maximum dose of acetminophen (tylenol) you can take from all sources. Please check other over-the-counter medications and prescriptions to ensure you are not taking other medications that contain acetaminophen.  You may also take ibuprofen 400 mg 6 times a day OR 600mg  4 times a day alternating with or at the same time as tylenol.  OTC lidocaine patch may also help with your pain.  It was a pleasure caring for you today!

## 2022-05-10 NOTE — ED Triage Notes (Signed)
Pt c/o right knee pain for the past week. Denies any injury. States pain worsens when he puts pressure on it.

## 2022-05-10 NOTE — ED Provider Notes (Signed)
[facility] Provider Note   CSN: [id number] Arrival date & time: 05/10/22  4235     History {Add pertinent medical, surgical, social history, OB history to HPI:1} Chief Complaint  Patient presents with   Knee Pain    Kyle Terry is a 31 y.o. male.  HPI      In the past was exercising and was worse doing legs  Now it is happening again  Over the last week Has been doing some leg work, more than what did on knees, most lunges No falls, no injuries No fevers, tick bites, dysuria No hx of blood infection or IVDU Right up at top of knee cap on right  Use a little ibuprofen,  Worse on stairs  Home Medications Prior to Admission medications   Medication Sig Start Date End Date Taking? Authorizing Provider  cetirizine (ZYRTEC ALLERGY) 10 MG tablet Take 1 tablet (10 mg total) by mouth at bedtime. 11/30/21 05/29/22  Lynden Oxford Scales, PA-C  Dapsone 5 % topical gel Apply 1 application. topically 2 (two) times daily. 11/30/21   Lynden Oxford Scales, PA-C  montelukast (SINGULAIR) 10 MG tablet Take 1 tablet (10 mg total) by mouth at bedtime. 11/30/21 05/29/22  Lynden Oxford Scales, PA-C  olopatadine (PATADAY) 0.1 % ophthalmic solution Place 1 drop into the left eye 2 (two) times daily as needed for allergies. 01/24/22   Barrett Henle, MD  triamcinolone (KENALOG) 0.025 % cream Apply 1 application. topically 2 (two) times daily. Apply to affected areas of face and neck twice daily as needed for rash. 11/30/21   Lynden Oxford Scales, PA-C  triamcinolone cream (KENALOG) 0.1 % Apply twice daily as needed to affected areas of the skin on the body and not the face or neck. 11/30/21   Lynden Oxford Scales, PA-C      Allergies    Patient has no known allergies.    Review of Systems   Review of Systems  Physical Exam Updated Vital Signs BP (!) 130/92   Pulse 69   Temp 97.9 F (36.6 C) (Oral)   Resp 17   Ht 5\' 8"  (1.727 m)   Wt  61.7 kg   SpO2 100%   BMI 20.68 kg/m  Physical Exam  ED Results / Procedures / Treatments   Labs (all labs ordered are listed, but only abnormal results are displayed) Labs Reviewed - No data to display  EKG None  Radiology DG Knee Complete 4 Views Right  Result Date: 05/10/2022 CLINICAL DATA:  Pain x1 week without trauma EXAM: RIGHT KNEE - COMPLETE 4+ VIEW COMPARISON:  None Available. FINDINGS: No evidence of fracture, dislocation, or joint effusion. Small patellar tendon traction spur. No evidence of arthropathy or other focal bone abnormality. Soft tissues are unremarkable. IMPRESSION: Negative. Electronically Signed   By: Lucrezia Europe M.D.   On: 05/10/2022 10:18    Procedures Procedures  {Document cardiac monitor, telemetry assessment procedure when appropriate:1}  Medications Ordered in ED Medications - No data to display  ED Course/ Medical Decision Making/ A&P                           Medical Decision Making Amount and/or Complexity of Data Reviewed Radiology: ordered.   ***  {Document critical care time when appropriate:1} {Document review of labs and clinical decision tools ie heart score, Chads2Vasc2 etc:1}  {Document your independent review of radiology images, and any outside records:1} {Document your discussion with  family members, caretakers, and with consultants:1} {Document social determinants of health affecting pt's care:1} {Document your decision making why or why not admission, treatments were needed:1} Final Clinical Impression(s) / ED Diagnoses Final diagnoses:  None    Rx / DC Orders ED Discharge Orders     None

## 2022-06-05 ENCOUNTER — Ambulatory Visit
Admission: EM | Admit: 2022-06-05 | Discharge: 2022-06-05 | Disposition: A | Discharge status: Home / Self Care | Payer: BC Managed Care – PPO | Attending: Internal Medicine | Admitting: Internal Medicine

## 2022-06-05 ENCOUNTER — Encounter: Payer: Self-pay | Admitting: Emergency Medicine

## 2022-06-05 DIAGNOSIS — J069 Acute upper respiratory infection, unspecified: Secondary | ICD-10-CM | POA: Diagnosis not present

## 2022-06-05 DIAGNOSIS — Z1152 Encounter for screening for COVID-19: Secondary | ICD-10-CM | POA: Insufficient documentation

## 2022-06-05 DIAGNOSIS — R42 Dizziness and giddiness: Secondary | ICD-10-CM | POA: Insufficient documentation

## 2022-06-05 DIAGNOSIS — R059 Cough, unspecified: Secondary | ICD-10-CM | POA: Diagnosis present

## 2022-06-05 LAB — RESP PANEL BY RT-PCR (FLU A&B, COVID) ARPGX2
Influenza A by PCR: POSITIVE — AB
Influenza B by PCR: NEGATIVE
SARS Coronavirus 2 by RT PCR: NEGATIVE

## 2022-06-05 MED ORDER — FLUTICASONE PROPIONATE 50 MCG/ACT NA SUSP: 1.0000 | Freq: Every day | NASAL | 0 refills | Status: DC

## 2022-06-05 MED ORDER — CETIRIZINE HCL 10 MG PO TABS: 10.0000 mg | ORAL_TABLET | Freq: Every day | ORAL | 0 refills | Status: DC

## 2022-06-05 MED ORDER — BENZONATATE 100 MG PO CAPS: 100.0000 mg | ORAL_CAPSULE | Freq: Three times a day (TID) | ORAL | 0 refills | Status: DC | PRN

## 2022-06-05 NOTE — ED Provider Notes (Signed)
[facility]    CSN: [id number] Arrival date & time: 06/05/22  9323      History   Chief Complaint Chief Complaint  Patient presents with   Cough   Dizziness   Nasal Congestion    HPI Kyle Terry is a 31 y.o. male.   Patient presents with nasal congestion, cough, feelings of chest congestion, chills, dizziness that started about 2 days ago.  States that his stepson has had similar symptoms.  He denies any known fevers at home.  Denies chest pain, shortness of breath, sore throat, ear pain, nausea, vomiting, diarrhea, abdominal pain.  Patient reports that he took "Aleve D" with minimal improvement in symptoms.   Cough Dizziness   History reviewed. No pertinent past medical history.  There are no problems to display for this patient.   Past Surgical History:  Procedure Laterality Date   APPENDECTOMY         Home Medications    Prior to Admission medications   Medication Sig Start Date End Date Taking? Authorizing Provider  benzonatate (TESSALON) 100 MG capsule Take 1 capsule (100 mg total) by mouth every 8 (eight) hours as needed for cough. 06/05/22  Yes Abed Schar, Michele Rockers, FNP  cetirizine (ZYRTEC) 10 MG tablet Take 1 tablet (10 mg total) by mouth daily. 06/05/22  Yes Elridge Stemm, Hildred Alamin E, FNP  fluticasone (FLONASE) 50 MCG/ACT nasal spray Place 1 spray into both nostrils daily. 06/05/22  Yes Ethyle Tiedt, Hildred Alamin E, FNP  Dapsone 5 % topical gel Apply 1 application. topically 2 (two) times daily. 11/30/21   Lynden Oxford Scales, PA-C  montelukast (SINGULAIR) 10 MG tablet Take 1 tablet (10 mg total) by mouth at bedtime. 11/30/21 05/29/22  Lynden Oxford Scales, PA-C  olopatadine (PATADAY) 0.1 % ophthalmic solution Place 1 drop into the left eye 2 (two) times daily as needed for allergies. 01/24/22   Barrett Henle, MD  triamcinolone (KENALOG) 0.025 % cream Apply 1 application. topically 2 (two) times daily. Apply to affected areas of face and neck twice daily  as needed for rash. 11/30/21   Lynden Oxford Scales, PA-C  triamcinolone cream (KENALOG) 0.1 % Apply twice daily as needed to affected areas of the skin on the body and not the face or neck. 11/30/21   Lynden Oxford Scales, PA-C    Family History Family History  Family history unknown: Yes    Social History Social History   Tobacco Use   Smoking status: Every Day    Types: Cigarettes, Cigars    Passive exposure: Never   Smokeless tobacco: Never  Vaping Use   Vaping Use: Former  Substance Use Topics   Alcohol use: Not Currently    Comment: barely   Drug use: Not Currently    Types: Marijuana     Allergies   Patient has no known allergies.   Review of Systems Review of Systems Per HPI  Physical Exam Triage Vital Signs ED Triage Vitals  Enc Vitals Group     BP 06/05/22 1034 127/76     Pulse Rate 06/05/22 1034 64     Resp 06/05/22 1034 18     Temp 06/05/22 1034 98 F (36.7 C)     Temp src --      SpO2 06/05/22 1034 98 %     Weight --      Height --      Head Circumference --      Peak Flow --      Pain Score  06/05/22 1033 8     Pain Loc --      Pain Edu? --      Excl. in [place]? --    No data found.  Updated Vital Signs BP 127/76   Pulse 64   Temp 98 F (36.7 C)   Resp 18   SpO2 98%   Visual Acuity Right Eye Distance:   Left Eye Distance:   Bilateral Distance:    Right Eye Near:   Left Eye Near:    Bilateral Near:     Physical Exam Constitutional:      General: He is not in acute distress.    Appearance: Normal appearance. He is not toxic-appearing or diaphoretic.  HENT:     Head: Normocephalic and atraumatic.     Right Ear: Ear canal normal. No drainage, swelling or tenderness. A middle ear effusion is present. Tympanic membrane is not perforated, erythematous or bulging.     Left Ear: Ear canal normal. No drainage, swelling or tenderness. A middle ear effusion is present. Tympanic membrane is not perforated, erythematous or bulging.      Nose: Congestion present.     Mouth/Throat:     Mouth: Mucous membranes are moist.     Pharynx: No posterior oropharyngeal erythema.  Eyes:     Extraocular Movements: Extraocular movements intact.     Conjunctiva/sclera: Conjunctivae normal.     Pupils: Pupils are equal, round, and reactive to light.  Cardiovascular:     Rate and Rhythm: Normal rate and regular rhythm.     Pulses: Normal pulses.     Heart sounds: Normal heart sounds.  Pulmonary:     Effort: Pulmonary effort is normal. No respiratory distress.     Breath sounds: Normal breath sounds. No stridor. No wheezing, rhonchi or rales.  Abdominal:     General: Abdomen is flat. Bowel sounds are normal.     Palpations: Abdomen is soft.  Musculoskeletal:        General: Normal range of motion.     Cervical back: Normal range of motion.  Skin:    General: Skin is warm and dry.  Neurological:     General: No focal deficit present.     Mental Status: He is alert and oriented to person, place, and time. Mental status is at baseline.  Psychiatric:        Mood and Affect: Mood normal.        Behavior: Behavior normal.      UC Treatments / Results  Labs (all labs ordered are listed, but only abnormal results are displayed) Labs Reviewed  RESP PANEL BY RT-PCR (FLU A&B, COVID) [id number]    EKG   Radiology No results found.  Procedures Procedures (including critical care time)  Medications Ordered in UC Medications - No data to display  Initial Impression / Assessment and Plan / UC Course  I have reviewed the triage vital signs and the nursing notes.  Pertinent labs & imaging results that were available during my care of the patient were reviewed by me and considered in my medical decision making (see chart for details).     Patient presents with symptoms likely from a viral upper respiratory infection. Differential includes bacterial pneumonia, sinusitis, allergic rhinitis, COVID-19, flu, RSV. Do not suspect  underlying cardiopulmonary process. Symptoms seem unlikely related to ACS, CHF or COPD exacerbations, pneumonia, pneumothorax. Patient is nontoxic appearing and not in need of emergent medical intervention.  COVID and flu test pending.  Recommended symptom  control with medications to help alleviate symptoms.  Patient denies that he takes any daily medications including antihistamines so he was sent medications to help alleviate symptoms.  Return if symptoms fail to improve in 1-2 weeks or you develop shortness of breath, chest pain, severe headache. Patient states understanding and is agreeable.  Discharged with PCP followup.  Final Clinical Impressions(s) / UC Diagnoses   Final diagnoses:  Viral upper respiratory tract infection with cough     Discharge Instructions      You have a viral upper respiratory infection which should run its course and self resolve with symptomatic treatment as we discussed.  I have prescribed you a few medications to alleviate symptoms.  COVID and flu test are pending.  Will call if  they are positive.  Please follow-up if symptoms persist or worsen.    ED Prescriptions     Medication Sig Dispense Auth. Provider   benzonatate (TESSALON) 100 MG capsule Take 1 capsule (100 mg total) by mouth every 8 (eight) hours as needed for cough. 21 capsule [place], [place], [place]   cetirizine (ZYRTEC) 10 MG tablet Take 1 tablet (10 mg total) by mouth daily. 30 tablet [place], [place] E, [place]   fluticasone [facility]) 50 MCG/ACT nasal spray Place 1 spray into both nostrils daily. 16 g Teodora Medici, [place]      PDMP not reviewed this encounter.   Teodora Medici, [place] 06/05/22 1135

## 2022-06-05 NOTE — ED Triage Notes (Signed)
Pt dis present today with c/o cough, chest congestion, chills, dizziness x2 days

## 2022-06-05 NOTE — Discharge Instructions (Signed)
You have a viral upper respiratory infection which should run its course and self resolve with symptomatic treatment as we discussed.  I have prescribed you a few medications to alleviate symptoms.  COVID and flu test are pending.  Will call if  they are positive.  Please follow-up if symptoms persist or worsen.

## 2022-06-27 ENCOUNTER — Emergency Department (HOSPITAL_BASED_OUTPATIENT_CLINIC_OR_DEPARTMENT_OTHER)
Admission: EM | Admit: 2022-06-27 | Discharge: 2022-06-27 | Disposition: A | Discharge status: Home / Self Care | Payer: BC Managed Care – PPO | Attending: Emergency Medicine | Admitting: Emergency Medicine

## 2022-06-27 ENCOUNTER — Encounter (HOSPITAL_BASED_OUTPATIENT_CLINIC_OR_DEPARTMENT_OTHER): Payer: Self-pay

## 2022-06-27 ENCOUNTER — Other Ambulatory Visit: Payer: Self-pay

## 2022-06-27 DIAGNOSIS — R6884 Jaw pain: Secondary | ICD-10-CM | POA: Diagnosis present

## 2022-06-27 DIAGNOSIS — K0889 Other specified disorders of teeth and supporting structures: Secondary | ICD-10-CM | POA: Insufficient documentation

## 2022-06-27 MED ORDER — KETOROLAC TROMETHAMINE 15 MG/ML IJ SOLN
15.0000 mg | Freq: Once | INTRAMUSCULAR | Status: AC
  Administered 2022-06-27: 15 mg via INTRAMUSCULAR
  Filled 2022-06-27: qty 1

## 2022-06-27 MED ORDER — AMOXICILLIN 500 MG PO CAPS: 500.0000 mg | ORAL_CAPSULE | Freq: Three times a day (TID) | ORAL | 0 refills | Status: DC

## 2022-06-27 MED ORDER — LIDOCAINE VISCOUS HCL 2 % MT SOLN
15.0000 mL | Freq: Once | OROMUCOSAL | Status: AC
  Administered 2022-06-27: 15 mL via OROMUCOSAL
  Filled 2022-06-27: qty 15

## 2022-06-27 MED ORDER — LIDOCAINE VISCOUS HCL 2 % MT SOLN: 15.0000 mL | OROMUCOSAL | 0 refills | Status: DC | PRN

## 2022-06-27 NOTE — ED Provider Notes (Signed)
[facility]   CSN: [id number] Arrival date & time: 06/27/22  1334     History Chief Complaint  Patient presents with   Jaw Pain    Kyle Terry is a 31 y.o. male patient who presents to the emergency department today for further evaluation of left-sided upper jaw pain it has been ongoing for last 3 days.  Patient initially had a tooth ache in that same area.  He does state that this tooth does have a cavity and needs to be either pulled or have a crown.  He has not yet done this.  The last 3 days he has had a throbbing sensation in his jaw.  He denies any trouble eating or swallowing.  Denies any trouble talking.  No fever or chills.  HPI     Home Medications Prior to Admission medications   Medication Sig Start Date End Date Taking? Authorizing Provider  amoxicillin (AMOXIL) 500 MG capsule Take 1 capsule (500 mg total) by mouth 3 (three) times daily. 06/27/22  Yes Raul Del, Kinza Gouveia M, PA-C  lidocaine (XYLOCAINE) 2 % solution Use as directed 15 mLs in the mouth or throat as needed for mouth pain. 06/27/22  Yes Raul Del, Tinya Cadogan M, PA-C  benzonatate (TESSALON) 100 MG capsule Take 1 capsule (100 mg total) by mouth every 8 (eight) hours as needed for cough. 06/05/22   Teodora Medici, FNP  cetirizine (ZYRTEC) 10 MG tablet Take 1 tablet (10 mg total) by mouth daily. 06/05/22   Teodora Medici, FNP  Dapsone 5 % topical gel Apply 1 application. topically 2 (two) times daily. 11/30/21   Lynden Oxford Scales, PA-C  fluticasone (FLONASE) 50 MCG/ACT nasal spray Place 1 spray into both nostrils daily. 06/05/22   Teodora Medici, FNP  montelukast (SINGULAIR) 10 MG tablet Take 1 tablet (10 mg total) by mouth at bedtime. 11/30/21 05/29/22  Lynden Oxford Scales, PA-C  olopatadine (PATADAY) 0.1 % ophthalmic solution Place 1 drop into the left eye 2 (two) times daily as needed for allergies. 01/24/22   Barrett Henle, MD  triamcinolone (KENALOG) 0.025  % cream Apply 1 application. topically 2 (two) times daily. Apply to affected areas of face and neck twice daily as needed for rash. 11/30/21   Lynden Oxford Scales, PA-C  triamcinolone cream (KENALOG) 0.1 % Apply twice daily as needed to affected areas of the skin on the body and not the face or neck. 11/30/21   Lynden Oxford Scales, PA-C      Allergies    Patient has no known allergies.    Review of Systems   Review of Systems  All other systems reviewed and are negative.   Physical Exam Updated Vital Signs BP 127/87 (BP Location: Right Arm)   Pulse 79   Temp 98.7 F (37.1 C) (Oral)   Resp 18   Ht 5\' 8"  (1.727 m)   Wt 61.7 kg   SpO2 100%   BMI 20.68 kg/m  Physical Exam Vitals and nursing note reviewed.  Constitutional:      Appearance: Normal appearance.  HENT:     Head: Normocephalic and atraumatic.     Mouth/Throat:   Eyes:     General:        Right eye: No discharge.        Left eye: No discharge.     Conjunctiva/sclera: Conjunctivae normal.  Pulmonary:     Effort: Pulmonary effort is normal.  Skin:    General: Skin is  warm and dry.     Findings: No rash.  Neurological:     General: No focal deficit present.     Mental Status: He is alert.  Psychiatric:        Mood and Affect: Mood normal.        Behavior: Behavior normal.     ED Results / Procedures / Treatments   Labs (all labs ordered are listed, but only abnormal results are displayed) Labs Reviewed - No data to display  EKG None  Radiology No results found.  Procedures Procedures    Medications Ordered in ED Medications  ketorolac (TORADOL) 15 MG/ML injection 15 mg (has no administration in time range)  lidocaine (XYLOCAINE) 2 % viscous mouth solution 15 mL (has no administration in time range)    ED Course/ Medical Decision Making/ A&P                           Medical Decision Making [name] is a 31 y.o. male patient who presents to the emergency department today  for further evaluation of left-sided jaw pain.  I think this is referred pain from his tooth.  I will start him on a short course of amoxicillin to cover any underlying dental infection.  Do not see any obvious underlying abscess and I have a low suspicion for deep-seated infection at this time.  I have a low suspicion for Ludwig's angina.  I will give him some Toradol and viscous lidocaine here to help with pain control and discharged with antibiotics as mentioned earlier.  I have given him a list of dentists he can call at his earliest convenience.  Strict return precautions were discussed.  He is safe for discharge.   Risk Prescription drug management.    Final Clinical Impression(s) / ED Diagnoses Final diagnoses:  Pain, dental    Rx / DC Orders ED Discharge Orders          Ordered    amoxicillin (AMOXIL) 500 MG capsule  3 times daily        06/27/22 1432    lidocaine (XYLOCAINE) 2 % solution  As needed        06/27/22 1432              Myna Bright [place], [place] 06/27/22 1439    Lajean Saver, MD 07/04/22 1049

## 2022-06-27 NOTE — Discharge Instructions (Addendum)
This is likely coming from the tooth that needs a crown from the cavity.  I have given you 2 prescriptions today.  The first is amoxicillin to cover for any underlying infection in the mouth.  The second is the liquid lidocaine that we are talking about.  Please follow instructions for both medications on how to take.  I have given you a list of dentist you can call at your earliest convenience to schedule an appointment.  Return to the emergency department for any worsening symptoms you might have.

## 2022-06-27 NOTE — ED Notes (Signed)
Patient verbalizes understanding of discharge instructions. Opportunity for questioning and answers were provided. Patient discharged from ED.  °

## 2022-06-27 NOTE — ED Triage Notes (Signed)
Patient here POV from Home.  Endorses Left Molar Tooth Pain that began 1-2 Weeks ago. Tooth Pain subsided somewhat 3 Days ago but then began to have Left Sided Jaw Pain/Swelling.   No Fevers.   NAD Noted during Triage. A&Ox4. GCS 15. Ambulatory.

## 2022-09-15 ENCOUNTER — Other Ambulatory Visit: Payer: Self-pay

## 2022-09-15 ENCOUNTER — Encounter (HOSPITAL_BASED_OUTPATIENT_CLINIC_OR_DEPARTMENT_OTHER): Payer: Self-pay | Admitting: Emergency Medicine

## 2022-09-15 ENCOUNTER — Emergency Department (HOSPITAL_BASED_OUTPATIENT_CLINIC_OR_DEPARTMENT_OTHER)
Admission: EM | Admit: 2022-09-15 | Discharge: 2022-09-15 | Disposition: A | Discharge status: Home / Self Care | Payer: Commercial Managed Care - PPO | Attending: Emergency Medicine | Admitting: Emergency Medicine

## 2022-09-15 DIAGNOSIS — M79642 Pain in left hand: Secondary | ICD-10-CM | POA: Insufficient documentation

## 2022-09-15 MED ORDER — IBUPROFEN 800 MG PO TABS: 800.0000 mg | ORAL_TABLET | Freq: Once | ORAL | Status: DC

## 2022-09-15 NOTE — ED Triage Notes (Signed)
Left hand with intermittent pain for about 2 weeks, feels like arthritic achy type pain per pt. No injury.

## 2022-09-15 NOTE — ED Provider Notes (Signed)
[facility] Provider Note   CSN: [id number] Arrival date & time: 09/15/22  [id number]     History  Chief Complaint  Patient presents with   Hand Pain    Kyle Terry is a 32 y.o. male.  With no significant past medical history who presents to the ED for evaluation of left hand pain.  Symptoms have been intermittent and present for the past 2 weeks.  States he gets a mild aching pain a few times a day that lasts 30 minutes to an hour and then resolve spontaneously.  He has not tried any home interventions including pain medicines, icing or bracing.  Denies numbness, weakness or tingling.  Denies any injuries to the area.  Currently denies having any pain.   Hand Pain       Home Medications Prior to Admission medications   Medication Sig Start Date End Date Taking? Authorizing Provider  amoxicillin (AMOXIL) 500 MG capsule Take 1 capsule (500 mg total) by mouth 3 (three) times daily. 06/27/22   Myna Bright M, PA-C  benzonatate (TESSALON) 100 MG capsule Take 1 capsule (100 mg total) by mouth every 8 (eight) hours as needed for cough. 06/05/22   Teodora Medici, FNP  cetirizine (ZYRTEC) 10 MG tablet Take 1 tablet (10 mg total) by mouth daily. 06/05/22   Teodora Medici, FNP  Dapsone 5 % topical gel Apply 1 application. topically 2 (two) times daily. 11/30/21   Lynden Oxford Scales, PA-C  fluticasone (FLONASE) 50 MCG/ACT nasal spray Place 1 spray into both nostrils daily. 06/05/22   Teodora Medici, FNP  lidocaine (XYLOCAINE) 2 % solution Use as directed 15 mLs in the mouth or throat as needed for mouth pain. 06/27/22   Myna Bright M, PA-C  montelukast (SINGULAIR) 10 MG tablet Take 1 tablet (10 mg total) by mouth at bedtime. 11/30/21 05/29/22  Lynden Oxford Scales, PA-C  olopatadine (PATADAY) 0.1 % ophthalmic solution Place 1 drop into the left eye 2 (two) times daily as needed for allergies. 01/24/22   Barrett Henle, MD   triamcinolone (KENALOG) 0.025 % cream Apply 1 application. topically 2 (two) times daily. Apply to affected areas of face and neck twice daily as needed for rash. 11/30/21   Lynden Oxford Scales, PA-C  triamcinolone cream (KENALOG) 0.1 % Apply twice daily as needed to affected areas of the skin on the body and not the face or neck. 11/30/21   Lynden Oxford Scales, PA-C      Allergies    Patient has no known allergies.    Review of Systems   Review of Systems  Musculoskeletal:  Positive for arthralgias.  All other systems reviewed and are negative.   Physical Exam Updated Vital Signs BP 131/69 (BP Location: Right Arm)   Pulse 63   Temp 97.9 F (36.6 C) (Oral)   Resp 14   Ht 5' 7.5" (1.715 m)   Wt 62.1 kg   SpO2 100%   BMI 21.14 kg/m  Physical Exam Vitals and nursing note reviewed.  Constitutional:      General: He is not in acute distress.    Appearance: Normal appearance. He is normal weight. He is not ill-appearing.  HENT:     Head: Normocephalic and atraumatic.  Pulmonary:     Effort: Pulmonary effort is normal. No respiratory distress.  Abdominal:     General: Abdomen is flat.  Musculoskeletal:        General: No swelling, tenderness,  deformity or signs of injury. Normal range of motion.     Cervical back: Neck supple.     Comments: Grip strength 5 out of 5 bilaterally.  Sensation intact in bilateral hands.  No tenderness to palpation including anatomical snuffbox tenderness.  Full finger and wrist active range of motion  Skin:    General: Skin is warm and dry.     Capillary Refill: Capillary refill takes less than 2 seconds.  Neurological:     Mental Status: He is alert and oriented to person, place, and time.  Psychiatric:        Mood and Affect: Mood normal.        Behavior: Behavior normal.     ED Results / Procedures / Treatments   Labs (all labs ordered are listed, but only abnormal results are displayed) Labs Reviewed - No data to  display  EKG None  Radiology No results found.  Procedures Procedures    Medications Ordered in ED Medications  ibuprofen (ADVIL) tablet 800 mg (has no administration in time range)    ED Course/ Medical Decision Making/ A&P                             Medical Decision Making Risk Prescription drug management.  This patient presents to the ED for concern of left hand pain, this involves an extensive number of treatment options, and is a complaint that carries with it a high risk of complications and morbidity.  The differential diagnosis includes strain, sprain, overuse, arthritis  My initial workup includes pain control, respiratory comfort  Additional history obtained from: Nursing notes from this visit.  Afebrile, hemodynamically stable.  32 year old male presenting to ED for evaluation of atraumatic left hand pain.  Physical exam is completely benign.  There is no tenderness to palpation.  His neurovascular status is intact.  Currently denies all pain.  Was given Motrin in the ED for inflammation control and wrist brace for comfort.  Does not have a primary care provider, he was encouraged to obtain 1 and follow-up if his symptoms do not improve.  Imaging not necessary due to atraumatic nature and lack of physical exam abnormalities.  He was encouraged to wear the wrist brace at all times, ice multiple times a day and alternate Tylenol and ibuprofen for pain and inflammation. he was given return precautions.  Stable at discharge.  At this time there does not appear to be any evidence of an acute emergency medical condition and the patient appears stable for discharge with appropriate outpatient follow up. Diagnosis was discussed with patient who verbalizes understanding of care plan and is agreeable to discharge. I have discussed return precautions with patient who verbalizes understanding. Patient encouraged to follow-up with their PCP within 1 week. All questions  answered.  Note: Portions of this report may have been transcribed using voice recognition software. Every effort was made to ensure accuracy; however, inadvertent computerized transcription errors may still be present.        Final Clinical Impression(s) / ED Diagnoses Final diagnoses:  Left hand pain    Rx / DC Orders ED Discharge Orders     None         Roylene Reason, Hershal Coria 09/15/22 [id number]    Regan Lemming, MD 09/15/22 1026

## 2022-09-15 NOTE — Discharge Instructions (Addendum)
You have been seen today for your complaint of left hand pain. Your discharge medications include Alternate tylenol and ibuprofen for pain. You may alternate these every 4 hours. You may take up to 800 mg of ibuprofen at a time and up to 1000 mg of tylenol.  Home care instructions are as follows:  Wear the wrist brace at all times.  You may ice multiple times per day Follow up with: A primary care provider of your choice in 1 week for reevaluation of your symptoms Please seek immediate medical care if you develop any of the following symptoms: Your pain does not get better after a few days. Your pain gets worse. Your pain affects your ability to do your daily activities. Your hand becomes warm, red, or swollen. Your hand is numb or tingling. At this time there does not appear to be the presence of an emergent medical condition, however there is always the potential for conditions to change. Please read and follow the below instructions.  Do not take your medicine if  develop an itchy rash, swelling in your mouth or lips, or difficulty breathing; call 911 and seek immediate emergency medical attention if this occurs.  You may review your lab tests and imaging results in their entirety on your MyChart account.  Please discuss all results of fully with your primary care provider and other specialist at your follow-up visit.  Note: Portions of this text may have been transcribed using voice recognition software. Every effort was made to ensure accuracy; however, inadvertent computerized transcription errors may still be present.

## 2022-10-18 ENCOUNTER — Ambulatory Visit
Admission: EM | Admit: 2022-10-18 | Discharge: 2022-10-18 | Disposition: A | Discharge status: Home / Self Care | Payer: Commercial Managed Care - PPO | Attending: Nurse Practitioner | Admitting: Nurse Practitioner

## 2022-10-18 DIAGNOSIS — J029 Acute pharyngitis, unspecified: Secondary | ICD-10-CM | POA: Diagnosis present

## 2022-10-18 LAB — POCT RAPID STREP A (OFFICE): Rapid Strep A Screen: NEGATIVE

## 2022-10-18 NOTE — Discharge Instructions (Signed)
Salt water gargles and warm liquids Over-the-counter ibuprofen or Tylenol as needed Follow-up with your PCP if your symptoms do not improve Please go to the ER for any worsening symptoms

## 2022-10-18 NOTE — ED Provider Notes (Signed)
[facility]    CSN: [id number] Arrival date & time: 10/18/22  1913      History   Chief Complaint Chief Complaint  Patient presents with   Sore Throat    HPI Kyle Terry is a 32 y.o. male  presents for evaluation of URI symptoms for 1 days. Patient reports associated symptoms of sore throat and some throat congestion. Denies N/V/D, cough, fevers, chills, body aches, shortness of breath. Patient does not have a hx of asthma or smoking. No known sick contacts.  Patient thinks it may be associated at work where he does deal with a lot of fine dust particles.  He does wear a respirator but states he still does inhale some.  Pt has taken nothing OTC for symptoms. Pt has no other concerns at this time.    Sore Throat    History reviewed. No pertinent past medical history.  There are no problems to display for this patient.   Past Surgical History:  Procedure Laterality Date   APPENDECTOMY         Home Medications    Prior to Admission medications   Medication Sig Start Date End Date Taking? Authorizing Provider  amoxicillin (AMOXIL) 500 MG capsule Take 1 capsule (500 mg total) by mouth 3 (three) times daily. 06/27/22   Honor LohFleming, Conner M, PA-C  benzonatate (TESSALON) 100 MG capsule Take 1 capsule (100 mg total) by mouth every 8 (eight) hours as needed for cough. 06/05/22   Gustavus BryantMound, Haley E, FNP  cetirizine (ZYRTEC) 10 MG tablet Take 1 tablet (10 mg total) by mouth daily. 06/05/22   Gustavus BryantMound, Haley E, FNP  Dapsone 5 % topical gel Apply 1 application. topically 2 (two) times daily. 11/30/21   Theadora RamaMorgan, Lindsay Scales, PA-C  fluticasone (FLONASE) 50 MCG/ACT nasal spray Place 1 spray into both nostrils daily. 06/05/22   Gustavus BryantMound, Haley E, FNP  lidocaine (XYLOCAINE) 2 % solution Use as directed 15 mLs in the mouth or throat as needed for mouth pain. 06/27/22   Honor LohFleming, Conner M, PA-C  montelukast (SINGULAIR) 10 MG tablet Take 1 tablet (10 mg total) by mouth at bedtime.  11/30/21 05/29/22  Theadora RamaMorgan, Lindsay Scales, PA-C  olopatadine (PATADAY) 0.1 % ophthalmic solution Place 1 drop into the left eye 2 (two) times daily as needed for allergies. 01/24/22   Zenia ResidesBanister, Pamela K, MD  triamcinolone (KENALOG) 0.025 % cream Apply 1 application. topically 2 (two) times daily. Apply to affected areas of face and neck twice daily as needed for rash. 11/30/21   Theadora RamaMorgan, Lindsay Scales, PA-C  triamcinolone cream (KENALOG) 0.1 % Apply twice daily as needed to affected areas of the skin on the body and not the face or neck. 11/30/21   Theadora RamaMorgan, Lindsay Scales, PA-C    Family History Family History  Family history unknown: Yes    Social History Social History   Tobacco Use   Smoking status: Every Day    Types: Cigarettes, Cigars    Passive exposure: Never   Smokeless tobacco: Never  Vaping Use   Vaping Use: Former  Substance Use Topics   Alcohol use: Yes    Comment: barely   Drug use: Not Currently    Types: Marijuana     Allergies   Patient has no known allergies.   Review of Systems Review of Systems  HENT:  Positive for sore throat.      Physical Exam Triage Vital Signs ED Triage Vitals  Enc Vitals Group  BP 10/18/22 1931 115/78     Pulse Rate 10/18/22 1931 84     Resp 10/18/22 1931 16     Temp 10/18/22 1931 97.7 F (36.5 C)     Temp Source 10/18/22 1931 Oral     SpO2 10/18/22 1931 98 %     Weight --      Height --      Head Circumference --      Peak Flow --      Pain Score 10/18/22 1930 6     Pain Loc --      Pain Edu? --      Excl. in GC? --    No data found.  Updated Vital Signs BP 115/78 (BP Location: Right Arm)   Pulse 84   Temp 97.7 F (36.5 C) (Oral)   Resp 16   SpO2 98%   Visual Acuity Right Eye Distance:   Left Eye Distance:   Bilateral Distance:    Right Eye Near:   Left Eye Near:    Bilateral Near:     Physical Exam Vitals and nursing note reviewed.  Constitutional:      General: He is not in acute distress.     Appearance: Normal appearance. He is not ill-appearing or toxic-appearing.  HENT:     Head: Normocephalic and atraumatic.     Right Ear: Tympanic membrane and ear canal normal.     Left Ear: Tympanic membrane and ear canal normal.     Nose: No congestion or rhinorrhea.     Mouth/Throat:     Mouth: Mucous membranes are moist.     Pharynx: Posterior oropharyngeal erythema present.  Eyes:     Pupils: Pupils are equal, round, and reactive to light.  Cardiovascular:     Rate and Rhythm: Normal rate and regular rhythm.     Heart sounds: Normal heart sounds.  Pulmonary:     Effort: Pulmonary effort is normal.     Breath sounds: Normal breath sounds.  Musculoskeletal:     Cervical back: Normal range of motion and neck supple.  Lymphadenopathy:     Cervical: No cervical adenopathy.  Skin:    General: Skin is warm and dry.  Neurological:     General: No focal deficit present.     Mental Status: He is alert and oriented to person, place, and time.  Psychiatric:        Mood and Affect: Mood normal.        Behavior: Behavior normal.      UC Treatments / Results  Labs (all labs ordered are listed, but only abnormal results are displayed) Labs Reviewed  CULTURE, GROUP A STREP [facility])  POCT RAPID STREP A (OFFICE)    EKG   Radiology No results found.  Procedures Procedures (including critical care time)  Medications Ordered in UC Medications - No data to display  Initial Impression / Assessment and Plan / UC Course  I have reviewed the triage vital signs and the nursing notes.  Pertinent labs & imaging results that were available during my care of the patient were reviewed by me and considered in my medical decision making (see chart for details).     Negative rapid strep, will culture Discussed viral versus allergic and to sore throat.  Salt water gargles and warm liquids OTC analgesics as needed PCP follow-up if symptoms do not improve ER precautions reviewed and  patient verbalized understanding Final Clinical Impressions(s) / UC Diagnoses   Final diagnoses:  Sore throat     Discharge Instructions      Salt water gargles and warm liquids Over-the-counter ibuprofen or Tylenol as needed Follow-up with your PCP if your symptoms do not improve Please go to the ER for any worsening symptoms    ED Prescriptions   None    PDMP not reviewed this encounter.   Radford PaxMayer, Jodi R, NP 10/18/22 2005

## 2022-10-18 NOTE — ED Triage Notes (Signed)
Patient c/o sore throat that began this morning.  Home interventions: none

## 2022-10-21 LAB — CULTURE, GROUP A STREP (THRC)

## 2022-11-14 ENCOUNTER — Encounter (HOSPITAL_BASED_OUTPATIENT_CLINIC_OR_DEPARTMENT_OTHER): Payer: Self-pay | Admitting: *Deleted

## 2022-11-14 ENCOUNTER — Other Ambulatory Visit: Payer: Self-pay

## 2022-11-14 ENCOUNTER — Emergency Department (HOSPITAL_BASED_OUTPATIENT_CLINIC_OR_DEPARTMENT_OTHER)
Admission: EM | Admit: 2022-11-14 | Discharge: 2022-11-14 | Disposition: A | Discharge status: Home / Self Care | Payer: Commercial Managed Care - PPO | Attending: Emergency Medicine | Admitting: Emergency Medicine

## 2022-11-14 ENCOUNTER — Emergency Department (HOSPITAL_BASED_OUTPATIENT_CLINIC_OR_DEPARTMENT_OTHER): Payer: Commercial Managed Care - PPO | Admitting: Radiology

## 2022-11-14 DIAGNOSIS — X500XXA Overexertion from strenuous movement or load, initial encounter: Secondary | ICD-10-CM | POA: Diagnosis not present

## 2022-11-14 DIAGNOSIS — M79641 Pain in right hand: Secondary | ICD-10-CM | POA: Diagnosis present

## 2022-11-14 DIAGNOSIS — Y92009 Unspecified place in unspecified non-institutional (private) residence as the place of occurrence of the external cause: Secondary | ICD-10-CM | POA: Insufficient documentation

## 2022-11-14 NOTE — ED Provider Notes (Signed)
[place] EM[facility]GENCY DEPARTMENT AT [facility] Provider Note   CSN: [id number] Arrival date & time: 11/14/22  1102     History  Chief Complaint  Patient presents with   Hand Pain    Kyle Terry is a 32 y.o. male who presents to the ED with concerns for right hand pain onset 1 week. Notes that he has been lifting weights at home prior to the onset of his symptoms. Notes some swelling to his right hand/wrist. No meds tried PTA. Denies redness, forearm pain.   The history is provided by the patient. No language interpreter was used.       Home Medications Prior to Admission medications   Medication Sig Start Date End Date Taking? Authorizing Provider  amoxicillin (AMOXIL) 500 MG capsule Take 1 capsule (500 mg total) by mouth 3 (three) times daily. 06/27/22   Honor Loh M, PA-C  benzonatate (TESSALON) 100 MG capsule Take 1 capsule (100 mg total) by mouth every 8 (eight) hours as needed for cough. 06/05/22   Gustavus Bryant, FNP  cetirizine (ZYRTEC) 10 MG tablet Take 1 tablet (10 mg total) by mouth daily. 06/05/22   Gustavus Bryant, FNP  Dapsone 5 % topical gel Apply 1 application. topically 2 (two) times daily. 11/30/21   Theadora Rama Scales, PA-C  fluticasone (FLONASE) 50 MCG/ACT nasal spray Place 1 spray into both nostrils daily. 06/05/22   Gustavus Bryant, FNP  lidocaine (XYLOCAINE) 2 % solution Use as directed 15 mLs in the mouth or throat as needed for mouth pain. 06/27/22   Honor Loh M, PA-C  montelukast (SINGULAIR) 10 MG tablet Take 1 tablet (10 mg total) by mouth at bedtime. 11/30/21 05/29/22  Theadora Rama Scales, PA-C  olopatadine (PATADAY) 0.1 % ophthalmic solution Place 1 drop into the left eye 2 (two) times daily as needed for allergies. 01/24/22   Zenia Resides, MD  triamcinolone (KENALOG) 0.025 % cream Apply 1 application. topically 2 (two) times daily. Apply to affected areas of face and neck twice daily as needed for rash. 11/30/21    Theadora Rama Scales, PA-C  triamcinolone cream (KENALOG) 0.1 % Apply twice daily as needed to affected areas of the skin on the body and not the face or neck. 11/30/21   Theadora Rama Scales, PA-C      Allergies    Patient has no known allergies.    Review of Systems   Review of Systems  All other systems reviewed and are negative.   Physical Exam Updated Vital Signs BP 119/81 (BP Location: Left Arm)   Pulse (!) 55   Temp 98.6 F (37 C) (Oral)   Resp 16   SpO2 100%  Physical Exam Vitals and nursing note reviewed.  Constitutional:      General: He is not in acute distress.    Appearance: Normal appearance.  Eyes:     General: No scleral icterus.    Extraocular Movements: Extraocular movements intact.  Cardiovascular:     Rate and Rhythm: Normal rate.  Pulmonary:     Effort: Pulmonary effort is normal. No respiratory distress.  Abdominal:     Palpations: Abdomen is soft. There is no mass.     Tenderness: There is no abdominal tenderness.  Musculoskeletal:        General: Normal range of motion.     Cervical back: Neck supple.     Comments: TTP noted to right 3rd and 4th metacarpals without appreciable deformity noted. Able to flex  and extend without issues. No appreciable swelling noted to right hand/wrist. Radial pulse intact. Grip strength 5/5.  Skin:    General: Skin is warm and dry.     Findings: No rash.  Neurological:     Mental Status: He is alert.     Sensory: Sensation is intact.     Motor: Motor function is intact.  Psychiatric:        Behavior: Behavior normal.     ED Results / Procedures / Treatments   Labs (all labs ordered are listed, but only abnormal results are displayed) Labs Reviewed - No data to display  EKG None  Radiology DG Wrist Complete Right  Result Date: 11/14/2022 CLINICAL DATA:  Pain along posterior side of the wrist at third and fourth MCP. EXAM: RIGHT WRIST - COMPLETE 3+ VIEW; RIGHT HAND - COMPLETE 3+ VIEW COMPARISON:  None  Available. FINDINGS: Wrist: There is no acute fracture or dislocation. Bony alignment is normal. The joint spaces are preserved. There is no erosive change. The soft tissues are unremarkable. Hand: There is no acute fracture or dislocation. Bony alignment is normal. The joint spaces are preserved. There is no erosive change. The soft tissues are unremarkable. IMPRESSION: No acute finding in the hand or wrist. Electronically Signed   By: Lesia Hausen M.D.   On: 11/14/2022 12:05   DG Hand Complete Right  Result Date: 11/14/2022 CLINICAL DATA:  Pain along posterior side of the wrist at third and fourth MCP. EXAM: RIGHT WRIST - COMPLETE 3+ VIEW; RIGHT HAND - COMPLETE 3+ VIEW COMPARISON:  None Available. FINDINGS: Wrist: There is no acute fracture or dislocation. Bony alignment is normal. The joint spaces are preserved. There is no erosive change. The soft tissues are unremarkable. Hand: There is no acute fracture or dislocation. Bony alignment is normal. The joint spaces are preserved. There is no erosive change. The soft tissues are unremarkable. IMPRESSION: No acute finding in the hand or wrist. Electronically Signed   By: Lesia Hausen M.D.   On: 11/14/2022 12:05    Procedures Procedures    Medications Ordered in ED Medications - No data to display  ED Course/ Medical Decision Making/ A&P Clinical Course as of 11/14/22 1341  Thu Nov 14, 2022  1237 Discussed discharge treatment plan. Pt agreeable at this time. Pt appears safe for discharge. [SB]    Clinical Course User Index [SB] Yvone Slape A, PA-C                             Medical Decision Making Amount and/or Complexity of Data Reviewed Radiology: ordered.   Patient with right wrist/hand pain onset 1 week status post lifting weights. Vital signs pt afebrile. On exam, patient with TTP noted to right 3rd and 4th metacarpals without appreciable deformity noted. Able to flex and extend without issues. No appreciable swelling noted to right  hand/wrist. Radial pulse intact. Grip strength 5/5. Differential diagnosis includes strain, fracture, dislocation.    Imaging: I ordered imaging studies including right hand/wrist xray I independently visualized and interpreted imaging which showed: No acute findings I agree with the radiologist interpretation   Disposition: Patient is suspicious for likely hand pain in the setting of overuse due to weightlifting. Doubt concerns for fracture, dislocation at this time. After consideration of the diagnostic results and the patients response to treatment, I feel that the patient would benefit from Discharge home.  Offered patient a wrist brace today, patient  declines at this time.  Patient provided with information for sports medicine group to follow-up regarding today's ED visit.  Offered work note, patient declined. Supportive care measures and strict return precautions discussed with patient at bedside. Pt acknowledges and verbalizes understanding. Pt appears safe for discharge. Follow up as indicated in discharge paperwork.    This chart was dictated using voice recognition software, Dragon. Despite the best efforts of this provider to proofread and correct errors, errors may still occur which can change documentation meaning.    Final Clinical Impression(s) / ED Diagnoses Final diagnoses:  Right hand pain    Rx / DC Orders ED Discharge Orders     None         Jakai Onofre A, PA-C 11/14/22 1342    Derwood Kaplan, MD 11/15/22 1302

## 2022-11-14 NOTE — Discharge Instructions (Signed)
It was a pleasure taking care of you!   Your x-ray was negative for fracture or dislocation.  You may take over the counter 600 mg Ibuprofen every 6 hours and alternate with 500 mg Tylenol every 6 hours as needed for pain for no more than 7 days. You may apply ice to affected area for up to 15 minutes at a time. Ensure to place a barrier between your skin and the ice. It may be prudent for you to discontinue with lifting heavy weights for the next 2 weeks to aid with your symptoms. Attached is information for the on-call sports medicine group, call and set up a follow up appointment as needed.  You may follow-up with your primary care provider as needed.  Return to the Emergency Department if you are experiencing increasing/worsening pain, swelling, color change, fever, or worsening symptoms.

## 2022-11-14 NOTE — ED Triage Notes (Addendum)
Pt is here for evaluation of right wrist and hand pain x one week which has become more severe in the past 2 days.  Not associated with any injury but pt does state that he began lifting weights.  Movement increases pain.  Pain in wrist and hand.

## 2022-11-14 NOTE — ED Notes (Signed)
Ice pack given to pt.

## 2022-12-04 ENCOUNTER — Ambulatory Visit
Admission: EM | Admit: 2022-12-04 | Discharge: 2022-12-04 | Disposition: A | Discharge status: Home / Self Care | Payer: Commercial Managed Care - PPO | Attending: Nurse Practitioner | Admitting: Nurse Practitioner

## 2022-12-04 DIAGNOSIS — R051 Acute cough: Secondary | ICD-10-CM | POA: Diagnosis present

## 2022-12-04 DIAGNOSIS — J069 Acute upper respiratory infection, unspecified: Secondary | ICD-10-CM | POA: Insufficient documentation

## 2022-12-04 DIAGNOSIS — U071 COVID-19: Secondary | ICD-10-CM | POA: Diagnosis not present

## 2022-12-04 MED ORDER — BENZONATATE 200 MG PO CAPS: 200.0000 mg | ORAL_CAPSULE | Freq: Three times a day (TID) | ORAL | 0 refills | Status: DC | PRN

## 2022-12-04 MED ORDER — ALBUTEROL SULFATE HFA 108 (90 BASE) MCG/ACT IN AERS: 1.0000 | INHALATION_SPRAY | Freq: Four times a day (QID) | RESPIRATORY_TRACT | 0 refills | Status: DC | PRN

## 2022-12-04 NOTE — ED Triage Notes (Signed)
Pt presents to UC w/ c/o sore throat, lightheaded, nasal drainage x2 days.Pt states he has started vaping.

## 2022-12-04 NOTE — ED Provider Notes (Signed)
[facility]    CSN: [id number] Arrival date & time: 12/04/22  1436      History   Chief Complaint Chief Complaint  Patient presents with   Nasal Congestion    HPI Kyle Terry is a 32 y.o. male  presents for evaluation of URI symptoms for 2 days. Patient reports associated symptoms of cough, congestion, sore throat and feeling like he cannot take a deep breath. Denies N/V/D, fevers, chills, ear pain, body aches, shortness of breath. Patient does have a hx of vaping/smoking.  No asthma history.  No known sick contacts.  Pt has taken nothing OTC for symptoms. Pt has no other concerns at this time.   HPI  History reviewed. No pertinent past medical history.  There are no problems to display for this patient.   Past Surgical History:  Procedure Laterality Date   APPENDECTOMY         Home Medications    Prior to Admission medications   Medication Sig Start Date End Date Taking? Authorizing Provider  albuterol (VENTOLIN HFA) 108 (90 Base) MCG/ACT inhaler Inhale 1-2 puffs into the lungs every 6 (six) hours as needed for wheezing or shortness of breath. 12/04/22  Yes Radford Pax, NP  benzonatate (TESSALON) 200 MG capsule Take 1 capsule (200 mg total) by mouth 3 (three) times daily as needed for cough. 12/04/22  Yes Radford Pax, NP  cetirizine (ZYRTEC) 10 MG tablet Take 1 tablet (10 mg total) by mouth daily. 06/05/22   Gustavus Bryant, FNP  Dapsone 5 % topical gel Apply 1 application. topically 2 (two) times daily. 11/30/21   Theadora Rama Scales, PA-C  fluticasone (FLONASE) 50 MCG/ACT nasal spray Place 1 spray into both nostrils daily. 06/05/22   Gustavus Bryant, FNP  lidocaine (XYLOCAINE) 2 % solution Use as directed 15 mLs in the mouth or throat as needed for mouth pain. 06/27/22   Honor Loh M, PA-C  montelukast (SINGULAIR) 10 MG tablet Take 1 tablet (10 mg total) by mouth at bedtime. 11/30/21 05/29/22  Theadora Rama Scales, PA-C  olopatadine  (PATADAY) 0.1 % ophthalmic solution Place 1 drop into the left eye 2 (two) times daily as needed for allergies. 01/24/22   Zenia Resides, MD  triamcinolone (KENALOG) 0.025 % cream Apply 1 application. topically 2 (two) times daily. Apply to affected areas of face and neck twice daily as needed for rash. 11/30/21   Theadora Rama Scales, PA-C  triamcinolone cream (KENALOG) 0.1 % Apply twice daily as needed to affected areas of the skin on the body and not the face or neck. 11/30/21   Theadora Rama Scales, PA-C    Family History Family History  Family history unknown: Yes    Social History Social History   Tobacco Use   Smoking status: Former    Types: Cigarettes, Cigars    Passive exposure: Never   Smokeless tobacco: Never  Vaping Use   Vaping Use: Former  Substance Use Topics   Alcohol use: Yes    Comment: barely   Drug use: Not Currently    Types: Marijuana     Allergies   Patient has no known allergies.   Review of Systems Review of Systems  HENT:  Positive for congestion and sore throat.   Respiratory:  Positive for cough.      Physical Exam Triage Vital Signs ED Triage Vitals  Enc Vitals Group     BP 12/04/22 1534 119/75     Pulse Rate  12/04/22 1534 73     Resp 12/04/22 1534 16     Temp 12/04/22 1534 98.1 F (36.7 C)     Temp Source 12/04/22 1534 Oral     SpO2 12/04/22 1534 96 %     Weight --      Height --      Head Circumference --      Peak Flow --      Pain Score 12/04/22 1538 0     Pain Loc --      Pain Edu? --      Excl. in GC? --    No data found.  Updated Vital Signs BP 119/75 (BP Location: Left Arm)   Pulse 73   Temp 98.1 F (36.7 C) (Oral)   Resp 16   SpO2 96%   Visual Acuity Right Eye Distance:   Left Eye Distance:   Bilateral Distance:    Right Eye Near:   Left Eye Near:    Bilateral Near:     Physical Exam Vitals and nursing note reviewed.  Constitutional:      General: He is not in acute distress.    Appearance:  Normal appearance. He is not ill-appearing or toxic-appearing.  HENT:     Head: Normocephalic and atraumatic.     Right Ear: Tympanic membrane and ear canal normal.     Left Ear: Tympanic membrane and ear canal normal.     Nose: Congestion present.     Mouth/Throat:     Mouth: Mucous membranes are moist.     Pharynx: Posterior oropharyngeal erythema present.  Eyes:     Pupils: Pupils are equal, round, and reactive to light.  Cardiovascular:     Rate and Rhythm: Normal rate and regular rhythm.     Heart sounds: Normal heart sounds.  Pulmonary:     Effort: Pulmonary effort is normal.     Breath sounds: Normal breath sounds.  Musculoskeletal:     Cervical back: Normal range of motion and neck supple.  Lymphadenopathy:     Cervical: No cervical adenopathy.  Skin:    General: Skin is warm and dry.  Neurological:     General: No focal deficit present.     Mental Status: He is alert and oriented to person, place, and time.  Psychiatric:        Mood and Affect: Mood normal.        Behavior: Behavior normal.      UC Treatments / Results  Labs (all labs ordered are listed, but only abnormal results are displayed) Labs Reviewed  SARS CORONAVIRUS 2 (TAT 6-24 HRS)    EKG   Radiology No results found.  Procedures Procedures (including critical care time)  Medications Ordered in UC Medications - No data to display  Initial Impression / Assessment and Plan / UC Course  I have reviewed the triage vital signs and the nursing notes.  Pertinent labs & imaging results that were available during my care of the patient were reviewed by me and considered in my medical decision making (see chart for details).     Symptoms with patient.  No red flags. COVID PCR and will contact if positive Discussed viral illness and symptomatic treatment Tessalon and albuterol as needed Rest and fluids PCP follow-up if symptoms do not improve ER precautions reviewed and patient verbalized  understanding Final Clinical Impressions(s) / UC Diagnoses   Final diagnoses:  Acute cough  Viral upper respiratory illness     Discharge Instructions  The clinic will contact you with results of the COVID swab done today if positive Tessalon as needed for cough Albuterol inhaler as needed if you feel like you cannot take a deep breath Rest and fluids Follow-up with your PCP if your symptoms do not improve Please go to the ER if you develop any worsening symptoms   ED Prescriptions     Medication Sig Dispense Auth. Provider   benzonatate (TESSALON) 200 MG capsule Take 1 capsule (200 mg total) by mouth 3 (three) times daily as needed for cough. 20 capsule Radford Pax, NP   albuterol (VENTOLIN HFA) 108 (90 Base) MCG/ACT inhaler Inhale 1-2 puffs into the lungs every 6 (six) hours as needed for wheezing or shortness of breath. 1 each Radford Pax, NP      PDMP not reviewed this encounter.   Radford Pax, NP 12/04/22 [phone]

## 2022-12-04 NOTE — Discharge Instructions (Signed)
The clinic will contact you with results of the COVID swab done today if positive Tessalon as needed for cough Albuterol inhaler as needed if you feel like you cannot take a deep breath Rest and fluids Follow-up with your PCP if your symptoms do not improve Please go to the ER if you develop any worsening symptoms

## 2022-12-05 LAB — SARS CORONAVIRUS 2 (TAT 6-24 HRS): SARS Coronavirus 2: POSITIVE — AB

## 2023-01-06 ENCOUNTER — Ambulatory Visit
Admission: EM | Admit: 2023-01-06 | Discharge: 2023-01-06 | Disposition: A | Discharge status: Home / Self Care | Payer: Commercial Managed Care - PPO | Attending: Nurse Practitioner | Admitting: Nurse Practitioner

## 2023-01-06 ENCOUNTER — Ambulatory Visit (INDEPENDENT_AMBULATORY_CARE_PROVIDER_SITE_OTHER): Payer: Commercial Managed Care - PPO

## 2023-01-06 DIAGNOSIS — R051 Acute cough: Secondary | ICD-10-CM

## 2023-01-06 DIAGNOSIS — R0789 Other chest pain: Secondary | ICD-10-CM | POA: Diagnosis not present

## 2023-01-06 NOTE — ED Provider Notes (Addendum)
[facility]    CSN: [id number] Arrival date & time: 01/06/23  1237      History   Chief Complaint Chief Complaint  Patient presents with   Cough    HPI Kyle Terry is a 32 y.o. male presents for evaluation of a cough.  Patient reports 2 days of some chest tightness with dry cough.  He reports he is a cigarette smoker and recently switched to vapes over the past couple days.  Since he has been vaping he has had a dry cough and some chest tightness with certain movements and coughing.  Denies any shortness of breath, fevers, sore throat, URI symptoms.  No asthma history.  He did have COVID in May but states his symptoms completely resolved.  No OTC medications have been used.  No other concerns at this time.   Cough   History reviewed. No pertinent past medical history.  There are no problems to display for this patient.   Past Surgical History:  Procedure Laterality Date   APPENDECTOMY         Home Medications    Prior to Admission medications   Medication Sig Start Date End Date Taking? Authorizing Provider  albuterol (VENTOLIN HFA) 108 (90 Base) MCG/ACT inhaler Inhale 1-2 puffs into the lungs every 6 (six) hours as needed for wheezing or shortness of breath. 12/04/22   Radford Pax, NP  benzonatate (TESSALON) 200 MG capsule Take 1 capsule (200 mg total) by mouth 3 (three) times daily as needed for cough. 12/04/22   Radford Pax, NP  cetirizine (ZYRTEC) 10 MG tablet Take 1 tablet (10 mg total) by mouth daily. 06/05/22   Gustavus Bryant, FNP  Dapsone 5 % topical gel Apply 1 application. topically 2 (two) times daily. 11/30/21   Theadora Rama Scales, PA-C  fluticasone (FLONASE) 50 MCG/ACT nasal spray Place 1 spray into both nostrils daily. 06/05/22   Gustavus Bryant, FNP  lidocaine (XYLOCAINE) 2 % solution Use as directed 15 mLs in the mouth or throat as needed for mouth pain. 06/27/22   Honor Loh M, PA-C  montelukast (SINGULAIR) 10 MG tablet Take  1 tablet (10 mg total) by mouth at bedtime. 11/30/21 05/29/22  Theadora Rama Scales, PA-C  olopatadine (PATADAY) 0.1 % ophthalmic solution Place 1 drop into the left eye 2 (two) times daily as needed for allergies. 01/24/22   Zenia Resides, MD  triamcinolone (KENALOG) 0.025 % cream Apply 1 application. topically 2 (two) times daily. Apply to affected areas of face and neck twice daily as needed for rash. 11/30/21   Theadora Rama Scales, PA-C  triamcinolone cream (KENALOG) 0.1 % Apply twice daily as needed to affected areas of the skin on the body and not the face or neck. 11/30/21   Theadora Rama Scales, PA-C    Family History Family History  Family history unknown: Yes    Social History Social History   Tobacco Use   Smoking status: Former    Types: Cigarettes, Cigars    Passive exposure: Never   Smokeless tobacco: Never  Vaping Use   Vaping Use: Former  Substance Use Topics   Alcohol use: Yes    Comment: barely   Drug use: Not Currently    Types: Marijuana     Allergies   Patient has no known allergies.   Review of Systems Review of Systems  Respiratory:  Positive for cough.      Physical Exam Triage Vital Signs ED Triage Vitals  Enc Vitals Group     BP 01/06/23 1307 117/78     Pulse Rate 01/06/23 1307 63     Resp 01/06/23 1307 17     Temp 01/06/23 1307 98.1 F (36.7 C)     Temp Source 01/06/23 1307 Oral     SpO2 01/06/23 1307 97 %     Weight --      Height --      Head Circumference --      Peak Flow --      Pain Score 01/06/23 1306 3     Pain Loc --      Pain Edu? --      Excl. in GC? --    No data found.  Updated Vital Signs BP 117/78 (BP Location: Left Arm)   Pulse 63   Temp 98.1 F (36.7 C) (Oral)   Resp 17   SpO2 97%   Visual Acuity Right Eye Distance:   Left Eye Distance:   Bilateral Distance:    Right Eye Near:   Left Eye Near:    Bilateral Near:     Physical Exam Vitals and nursing note reviewed.  Constitutional:       General: He is not in acute distress.    Appearance: Normal appearance. He is not ill-appearing or toxic-appearing.  HENT:     Head: Normocephalic and atraumatic.     Right Ear: Tympanic membrane and ear canal normal.     Left Ear: Tympanic membrane and ear canal normal.     Nose: No congestion or rhinorrhea.     Mouth/Throat:     Mouth: Mucous membranes are moist.     Pharynx: No oropharyngeal exudate or posterior oropharyngeal erythema.  Eyes:     Pupils: Pupils are equal, round, and reactive to light.  Cardiovascular:     Rate and Rhythm: Normal rate and regular rhythm.     Heart sounds: Normal heart sounds.  Pulmonary:     Effort: Pulmonary effort is normal.     Breath sounds: Normal breath sounds.  Chest:     Chest wall: No tenderness.  Musculoskeletal:     Cervical back: Normal range of motion and neck supple.  Lymphadenopathy:     Cervical: No cervical adenopathy.  Skin:    General: Skin is warm and dry.  Neurological:     General: No focal deficit present.     Mental Status: He is alert and oriented to person, place, and time.  Psychiatric:        Mood and Affect: Mood normal.        Behavior: Behavior normal.      UC Treatments / Results  Labs (all labs ordered are listed, but only abnormal results are displayed) Labs Reviewed - No data to display  EKG   Radiology DG Chest 2 View  Result Date: 01/06/2023 CLINICAL DATA:  Cough EXAM: CHEST - 2 VIEW COMPARISON:  None Available. FINDINGS: Cardiac and mediastinal contours are within normal limits. No focal pulmonary opacity. No pleural effusion or pneumothorax. No acute osseous abnormality. IMPRESSION: No acute cardiopulmonary process. Electronically Signed   By: Wiliam Ke M.D.   On: 01/06/2023 13:44    Procedures Procedures (including critical care time)  Medications Ordered in UC Medications - No data to display  Initial Impression / Assessment and Plan / UC Course  I have reviewed the triage vital  signs and the nursing notes.  Pertinent labs & imaging results that were available during my  care of the patient were reviewed by me and considered in my medical decision making (see chart for details).     Reviewed exam and symptoms with patient.  No red flags.  Chest x-ray normal.  Discussed symptoms likely secondary to vaping/musculoskeletal nature.  Reviewed cessation of vaping/smoking.  May take OTC ibuprofen as needed.  PCP follow-up if symptoms do not improve.  ER precautions reviewed and patient verbalized understanding. Final Clinical Impressions(s) / UC Diagnoses   Final diagnoses:  Acute cough  Chest wall pain     Discharge Instructions      Try to cut back on the amount that you vape and ideally stop altogether.  You may take ibuprofen over-the-counter as needed.  Please follow-up with your PCP if your symptoms do not improve.  Please go to the ER for any worsening symptoms.     ED Prescriptions   None    PDMP not reviewed this encounter.   Radford Pax, NP 01/06/23 1353    Radford Pax, NP 01/06/23 1353

## 2023-01-06 NOTE — Discharge Instructions (Signed)
Try to cut back on the amount that you vape and ideally stop altogether.  You may take ibuprofen over-the-counter as needed.  Please follow-up with your PCP if your symptoms do not improve.  Please go to the ER for any worsening symptoms.

## 2023-01-06 NOTE — ED Triage Notes (Signed)
Pt presents with c/o a cough, states he has chest pain when coughing X 2 days. It has worsened. Pt reports he has taken tylenol for pain.  Has been trying breathing exercises.  Denies fever.

## 2023-02-27 ENCOUNTER — Ambulatory Visit
Admission: EM | Admit: 2023-02-27 | Discharge: 2023-02-27 | Disposition: A | Discharge status: Home / Self Care | Payer: Commercial Managed Care - PPO | Attending: Internal Medicine | Admitting: Internal Medicine

## 2023-02-27 DIAGNOSIS — A059 Bacterial foodborne intoxication, unspecified: Secondary | ICD-10-CM

## 2023-02-27 MED ORDER — ONDANSETRON 4 MG PO TBDP: 4.0000 mg | ORAL_TABLET | Freq: Three times a day (TID) | ORAL | 0 refills | Status: DC | PRN

## 2023-02-27 NOTE — ED Triage Notes (Signed)
Pt presents with c/o nausea and stomach pain X 2 days.   Pt reports he had diarrhea yesterday. Denies vomiting.

## 2023-02-27 NOTE — ED Provider Notes (Addendum)
[facility]    CSN: [id number] Arrival date & time: 02/27/23  1321      History   Chief Complaint Chief Complaint  Patient presents with   Abdominal Pain    HPI Kyle Terry is a 32 y.o. male presents for evaluation of possible food poisoning.  Patient reports 2 days ago he ate some old spaghetti sauce in the refrigerator and afterwards felt a little off.  He then went and had a cookout including a milkshake and the next morning he awoke with some abdominal cramping nausea and a little diarrhea.  No vomiting, fevers or chills.  States he was bloated but that is since resolved.  Denies any additional abdominal pain or cramping or diarrhea just feels off.  Is been able to drink fluids normally.  No dysuria does not have much of an appetite currently.  No history of GI diagnoses such as Crohn's, IBS, colitis.  No recent travel.  No respiratory symptoms.  No OTC medications have been used for symptoms since onset.  No other concerns at this time.   Abdominal Pain Associated symptoms: nausea     No past medical history on file.  There are no problems to display for this patient.   Past Surgical History:  Procedure Laterality Date   APPENDECTOMY         Home Medications    Prior to Admission medications   Medication Sig Start Date End Date Taking? Authorizing Provider  ondansetron (ZOFRAN-ODT) 4 MG disintegrating tablet Take 1 tablet (4 mg total) by mouth every 8 (eight) hours as needed for nausea or vomiting. 02/27/23  Yes Radford Pax, NP  albuterol (VENTOLIN HFA) 108 (90 Base) MCG/ACT inhaler Inhale 1-2 puffs into the lungs every 6 (six) hours as needed for wheezing or shortness of breath. 12/04/22   Radford Pax, NP  benzonatate (TESSALON) 200 MG capsule Take 1 capsule (200 mg total) by mouth 3 (three) times daily as needed for cough. 12/04/22   Radford Pax, NP  cetirizine (ZYRTEC) 10 MG tablet Take 1 tablet (10 mg total) by mouth daily. 06/05/22    Gustavus Bryant, FNP  Dapsone 5 % topical gel Apply 1 application. topically 2 (two) times daily. 11/30/21   Theadora Rama Scales, PA-C  fluticasone (FLONASE) 50 MCG/ACT nasal spray Place 1 spray into both nostrils daily. 06/05/22   Gustavus Bryant, FNP  lidocaine (XYLOCAINE) 2 % solution Use as directed 15 mLs in the mouth or throat as needed for mouth pain. 06/27/22   Honor Loh M, PA-C  montelukast (SINGULAIR) 10 MG tablet Take 1 tablet (10 mg total) by mouth at bedtime. 11/30/21 05/29/22  Theadora Rama Scales, PA-C  olopatadine (PATADAY) 0.1 % ophthalmic solution Place 1 drop into the left eye 2 (two) times daily as needed for allergies. 01/24/22   Zenia Resides, MD  triamcinolone (KENALOG) 0.025 % cream Apply 1 application. topically 2 (two) times daily. Apply to affected areas of face and neck twice daily as needed for rash. 11/30/21   Theadora Rama Scales, PA-C  triamcinolone cream (KENALOG) 0.1 % Apply twice daily as needed to affected areas of the skin on the body and not the face or neck. 11/30/21   Theadora Rama Scales, PA-C    Family History Family History  Family history unknown: Yes    Social History Social History   Tobacco Use   Smoking status: Former    Types: Cigarettes, Cigars    Passive exposure:  Never   Smokeless tobacco: Never  Vaping Use   Vaping status: Former  Substance Use Topics   Alcohol use: Yes    Comment: barely   Drug use: Not Currently    Types: Marijuana     Allergies   Patient has no known allergies.   Review of Systems Review of Systems  Gastrointestinal:  Positive for abdominal pain and nausea.     Physical Exam Triage Vital Signs ED Triage Vitals [02/27/23 1349]  Encounter Vitals Group     BP 111/74     Systolic BP Percentile      Diastolic BP Percentile      Pulse Rate 72     Resp 17     Temp 97.7 F (36.5 C)     Temp Source Oral     SpO2 96 %     Weight      Height      Head Circumference      Peak Flow       Pain Score 5     Pain Loc      Pain Education      Exclude from Growth Chart    No data found.  Updated Vital Signs BP 111/74 (BP Location: Right Arm)   Pulse 72   Temp 97.7 F (36.5 C) (Oral)   Resp 17   SpO2 96%   Visual Acuity Right Eye Distance:   Left Eye Distance:   Bilateral Distance:    Right Eye Near:   Left Eye Near:    Bilateral Near:     Physical Exam Vitals and nursing note reviewed.  Constitutional:      General: He is not in acute distress.    Appearance: Normal appearance. He is not ill-appearing.  HENT:     Head: Normocephalic and atraumatic.  Eyes:     Pupils: Pupils are equal, round, and reactive to light.  Cardiovascular:     Rate and Rhythm: Normal rate.  Pulmonary:     Effort: Pulmonary effort is normal.  Abdominal:     General: Bowel sounds are normal.     Palpations: Abdomen is soft. There is no hepatomegaly or splenomegaly.     Tenderness: There is generalized abdominal tenderness. Negative signs include Rovsing's sign and McBurney's sign.  Skin:    General: Skin is warm and dry.  Neurological:     General: No focal deficit present.     Mental Status: He is alert and oriented to person, place, and time.  Psychiatric:        Mood and Affect: Mood normal.        Behavior: Behavior normal.      UC Treatments / Results  Labs (all labs ordered are listed, but only abnormal results are displayed) Labs Reviewed - No data to display  EKG   Radiology No results found.  Procedures Procedures (including critical care time)  Medications Ordered in UC Medications - No data to display  Initial Impression / Assessment and Plan / UC Course  I have reviewed the triage vital signs and the nursing notes.  Pertinent labs & imaging results that were available during my care of the patient were reviewed by me and considered in my medical decision making (see chart for details).     Reviewed exam and symptoms with patient.  No red flags.   Discussed likely food poisoning with resolving symptoms.  Rx for Zofran as needed for nausea.  Discussed bland/brat diet and advance as  tolerated.  Encourage fluids and rest.  PCP follow-up if symptoms do not improve.  ER precautions reviewed and patient verbalized understanding. Final Clinical Impressions(s) / UC Diagnoses   Final diagnoses:  Food poisoning     Discharge Instructions      You may take Zofran as needed for any nausea.  Stick to a bland diet until your symptoms resolve.  This includes bread, rice, applesauce, toast.  Lots of fluids with water, ginger ale, Gatorade, Powerade.  Please follow-up with your PCP if your symptoms do not improve.  Please go to the ER for any worsening symptoms.  I hope you feel better soon!    ED Prescriptions     Medication Sig Dispense Auth. Provider   ondansetron (ZOFRAN-ODT) 4 MG disintegrating tablet Take 1 tablet (4 mg total) by mouth every 8 (eight) hours as needed for nausea or vomiting. 20 tablet Radford Pax, NP      PDMP not reviewed this encounter.   Radford Pax, NP 02/27/23 1405    Radford Pax, NP 02/27/23 1406

## 2023-02-27 NOTE — Discharge Instructions (Signed)
You may take Zofran as needed for any nausea.  Stick to a bland diet until your symptoms resolve.  This includes bread, rice, applesauce, toast.  Lots of fluids with water, ginger ale, Gatorade, Powerade.  Please follow-up with your PCP if your symptoms do not improve.  Please go to the ER for any worsening symptoms.  I hope you feel better soon!

## 2023-03-31 ENCOUNTER — Ambulatory Visit
Admission: EM | Admit: 2023-03-31 | Discharge: 2023-03-31 | Disposition: A | Discharge status: Home / Self Care | Payer: Commercial Managed Care - PPO | Attending: Internal Medicine | Admitting: Internal Medicine

## 2023-03-31 DIAGNOSIS — R5383 Other fatigue: Secondary | ICD-10-CM

## 2023-03-31 DIAGNOSIS — R110 Nausea: Secondary | ICD-10-CM

## 2023-03-31 DIAGNOSIS — B349 Viral infection, unspecified: Secondary | ICD-10-CM | POA: Diagnosis not present

## 2023-03-31 MED ORDER — ONDANSETRON 4 MG PO TBDP: 4.0000 mg | ORAL_TABLET | Freq: Three times a day (TID) | ORAL | 0 refills | Status: DC | PRN

## 2023-03-31 MED ORDER — ONDANSETRON 4 MG PO TBDP
4.0000 mg | ORAL_TABLET | Freq: Once | ORAL | Status: AC
  Administered 2023-03-31: 4 mg via ORAL

## 2023-03-31 NOTE — Discharge Instructions (Signed)
You are given Zofran in clinic for nausea.  You may take this every 8 hours as needed.  Lots of rest.  Parke Simmers diet such as bananas, rice, applesauce, toast and advance as you tolerate.  Please follow-up with your PCP in 2 days for recheck.  Please go to the ER for any worsening symptoms.  I hope you feel better soon!

## 2023-03-31 NOTE — ED Triage Notes (Signed)
Pt presents to UC w/ c/o nausea, weakness starting this morning.

## 2023-03-31 NOTE — ED Provider Notes (Addendum)
[facility]    CSN: [id number] Arrival date & time: 03/31/23  0817      History   Chief Complaint Chief Complaint  Patient presents with   Nausea    HPI Kyle Terry is a 32 y.o. male  presents for evaluation of URI symptoms for 1 days. Patient reports associated symptoms of fatigue, lethargy, runny nose. Denies vomiting, diarrhea, ear pain. Patient does not have a hx of asthma. Patient does vape. reports roommate had similar symptoms recently.  Pt has taken nothing OTC for symptoms. Pt has no other concerns at this time.   HPI  History reviewed. No pertinent past medical history.  There are no problems to display for this patient.   Past Surgical History:  Procedure Laterality Date   APPENDECTOMY         Home Medications    Prior to Admission medications   Medication Sig Start Date End Date Taking? Authorizing Provider  albuterol (VENTOLIN HFA) 108 (90 Base) MCG/ACT inhaler Inhale 1-2 puffs into the lungs every 6 (six) hours as needed for wheezing or shortness of breath. 12/04/22   Radford Pax, NP  benzonatate (TESSALON) 200 MG capsule Take 1 capsule (200 mg total) by mouth 3 (three) times daily as needed for cough. 12/04/22   Radford Pax, NP  cetirizine (ZYRTEC) 10 MG tablet Take 1 tablet (10 mg total) by mouth daily. 06/05/22   Gustavus Bryant, FNP  Dapsone 5 % topical gel Apply 1 application. topically 2 (two) times daily. 11/30/21   Theadora Rama Scales, PA-C  fluticasone (FLONASE) 50 MCG/ACT nasal spray Place 1 spray into both nostrils daily. 06/05/22   Gustavus Bryant, FNP  lidocaine (XYLOCAINE) 2 % solution Use as directed 15 mLs in the mouth or throat as needed for mouth pain. 06/27/22   Honor Loh M, PA-C  montelukast (SINGULAIR) 10 MG tablet Take 1 tablet (10 mg total) by mouth at bedtime. 11/30/21 05/29/22  Theadora Rama Scales, PA-C  olopatadine (PATADAY) 0.1 % ophthalmic solution Place 1 drop into the left eye 2 (two) times daily  as needed for allergies. 01/24/22   Zenia Resides, MD  ondansetron (ZOFRAN-ODT) 4 MG disintegrating tablet Take 1 tablet (4 mg total) by mouth every 8 (eight) hours as needed for nausea or vomiting. 02/27/23   Radford Pax, NP  triamcinolone (KENALOG) 0.025 % cream Apply 1 application. topically 2 (two) times daily. Apply to affected areas of face and neck twice daily as needed for rash. 11/30/21   Theadora Rama Scales, PA-C  triamcinolone cream (KENALOG) 0.1 % Apply twice daily as needed to affected areas of the skin on the body and not the face or neck. 11/30/21   Theadora Rama Scales, PA-C    Family History Family History  Family history unknown: Yes    Social History Social History   Tobacco Use   Smoking status: Former    Types: Cigarettes, Cigars    Passive exposure: Never   Smokeless tobacco: Never  Vaping Use   Vaping status: Former  Substance Use Topics   Alcohol use: Yes    Comment: barely   Drug use: Not Currently    Types: Marijuana     Allergies   Patient has no known allergies.   Review of Systems Review of Systems  Constitutional:  Positive for fatigue.  HENT:  Positive for rhinorrhea.   Gastrointestinal:  Positive for nausea.     Physical Exam Triage Vital Signs ED Triage  Vitals  Encounter Vitals Group     BP 03/31/23 0833 123/76     Systolic BP Percentile --      Diastolic BP Percentile --      Pulse Rate 03/31/23 0833 82     Resp 03/31/23 0833 16     Temp 03/31/23 0833 97.9 F (36.6 C)     Temp Source 03/31/23 0833 Oral     SpO2 03/31/23 0833 97 %     Weight --      Height --      Head Circumference --      Peak Flow --      Pain Score 03/31/23 0836 0     Pain Loc --      Pain Education --      Exclude from Growth Chart --    No data found.  Updated Vital Signs BP 123/76 (BP Location: Right Arm)   Pulse 82   Temp 97.9 F (36.6 C) (Oral)   Resp 16   SpO2 97%   Visual Acuity Right Eye Distance:   Left Eye Distance:    Bilateral Distance:    Right Eye Near:   Left Eye Near:    Bilateral Near:     Physical Exam Vitals and nursing note reviewed.  Constitutional:      General: He is not in acute distress.    Appearance: Normal appearance. He is not ill-appearing or toxic-appearing.  HENT:     Head: Normocephalic and atraumatic.     Right Ear: Tympanic membrane and ear canal normal.     Left Ear: Tympanic membrane and ear canal normal.     Nose: No congestion or rhinorrhea.     Mouth/Throat:     Mouth: Mucous membranes are moist.     Pharynx: No oropharyngeal exudate or posterior oropharyngeal erythema.  Eyes:     Pupils: Pupils are equal, round, and reactive to light.  Cardiovascular:     Rate and Rhythm: Normal rate and regular rhythm.     Heart sounds: Normal heart sounds.  Pulmonary:     Effort: Pulmonary effort is normal.     Breath sounds: Normal breath sounds.  Abdominal:     General: Abdomen is flat. Bowel sounds are normal. There is no distension.     Palpations: Abdomen is soft.     Tenderness: There is no abdominal tenderness. There is no right CVA tenderness, left CVA tenderness, guarding or rebound.  Musculoskeletal:     Cervical back: Normal range of motion and neck supple.  Lymphadenopathy:     Cervical: No cervical adenopathy.  Skin:    General: Skin is warm and dry.  Neurological:     General: No focal deficit present.     Mental Status: He is alert and oriented to person, place, and time.  Psychiatric:        Mood and Affect: Mood normal.        Behavior: Behavior normal.      UC Treatments / Results  Labs (all labs ordered are listed, but only abnormal results are displayed) Labs Reviewed - No data to display  EKG   Radiology No results found.  Procedures Procedures (including critical care time)  Medications Ordered in UC Medications  ondansetron (ZOFRAN-ODT) disintegrating tablet 4 mg (4 mg Oral Given 03/31/23 0849)    Initial Impression /  Assessment and Plan / UC Course  I have reviewed the triage vital signs and the nursing notes.  Pertinent labs &  imaging results that were available during my care of the patient were reviewed by me and considered in my medical decision making (see chart for details).     Reviewed exam and symptoms with patient.  No red flags.  Patient declined COVID testing.  Zofran as needed.  Discussed viral syndrome and symptomatic treatment.  PCP follow-up 2 days for recheck.  ER precautions reviewed and patient verbalized understanding Final Clinical Impressions(s) / UC Diagnoses   Final diagnoses:  Nausea  Other fatigue  Acute viral syndrome     Discharge Instructions      You are given Zofran in clinic for nausea.  You may take this every 8 hours as needed.  Lots of rest.  Parke Simmers diet such as bananas, rice, applesauce, toast and advance as you tolerate.  Please follow-up with your PCP in 2 days for recheck.  Please go to the ER for any worsening symptoms.  I hope you feel better soon!    ED Prescriptions   None    PDMP not reviewed this encounter.   Radford Pax, NP 03/31/23 [phone]    Radford Pax, NP 03/31/23 [phone]

## 2023-05-21 ENCOUNTER — Ambulatory Visit
Admission: EM | Admit: 2023-05-21 | Discharge: 2023-05-21 | Disposition: A | Discharge status: Home / Self Care | Payer: Commercial Managed Care - PPO | Attending: Internal Medicine | Admitting: Internal Medicine

## 2023-05-21 DIAGNOSIS — R197 Diarrhea, unspecified: Secondary | ICD-10-CM | POA: Diagnosis not present

## 2023-05-21 LAB — POC COVID19/FLU A&B COMBO
Covid Antigen, POC: NEGATIVE
Influenza A Antigen, POC: NEGATIVE
Influenza B Antigen, POC: NEGATIVE

## 2023-05-21 NOTE — Discharge Instructions (Signed)
Eat a bland diet such as bananas, rice, applesauce, toast.  Stay hydrated with Gatorade, Powerade, Pedialyte, water.  You may use over-the-counter Imodium as needed.  Please follow-up with your PCP if your symptoms do not improve.  Please go to the ER for any worsening symptoms.  I hope you feel better soon!

## 2023-05-21 NOTE — ED Provider Notes (Signed)
[facility]    CSN: [id number] Arrival date & time: 05/21/23  1415      History   Chief Complaint No chief complaint on file.   HPI Kyle Terry is a 32 y.o. male presents for diarrhea.  Patient reports since his 90s been having multiple episodes of nonbloody liquid/partially formed nonbilious diarrhea.  And endorses some mild nausea this morning otherwise no vomiting, fevers, chills, dysuria, cough/congestion or URI symptoms.  No recent travel.  No ingestion of contaminated or undercooked foods.  Does report his uncle who he lives with has similar symptoms.  Denies history of IBS, Crohn's, colitis.  He is taking fluids normally.  He has not used any OTC medications since symptoms began.  No other concerns at this time.  HPI  History reviewed. No pertinent past medical history.  There are no problems to display for this patient.   Past Surgical History:  Procedure Laterality Date   APPENDECTOMY         Home Medications    Prior to Admission medications   Medication Sig Start Date End Date Taking? Authorizing Provider  albuterol (VENTOLIN HFA) 108 (90 Base) MCG/ACT inhaler Inhale 1-2 puffs into the lungs every 6 (six) hours as needed for wheezing or shortness of breath. 12/04/22   Radford Pax, NP  benzonatate (TESSALON) 200 MG capsule Take 1 capsule (200 mg total) by mouth 3 (three) times daily as needed for cough. 12/04/22   Radford Pax, NP  cetirizine (ZYRTEC) 10 MG tablet Take 1 tablet (10 mg total) by mouth daily. 06/05/22   Gustavus Bryant, FNP  Dapsone 5 % topical gel Apply 1 application. topically 2 (two) times daily. 11/30/21   Theadora Rama Scales, PA-C  fluticasone (FLONASE) 50 MCG/ACT nasal spray Place 1 spray into both nostrils daily. 06/05/22   Gustavus Bryant, FNP  lidocaine (XYLOCAINE) 2 % solution Use as directed 15 mLs in the mouth or throat as needed for mouth pain. 06/27/22   Honor Loh M, PA-C  montelukast (SINGULAIR) 10 MG tablet  Take 1 tablet (10 mg total) by mouth at bedtime. 11/30/21 05/29/22  Theadora Rama Scales, PA-C  olopatadine (PATADAY) 0.1 % ophthalmic solution Place 1 drop into the left eye 2 (two) times daily as needed for allergies. 01/24/22   Zenia Resides, MD  ondansetron (ZOFRAN-ODT) 4 MG disintegrating tablet Take 1 tablet (4 mg total) by mouth every 8 (eight) hours as needed for nausea or vomiting. 03/31/23   Radford Pax, NP  triamcinolone (KENALOG) 0.025 % cream Apply 1 application. topically 2 (two) times daily. Apply to affected areas of face and neck twice daily as needed for rash. 11/30/21   Theadora Rama Scales, PA-C  triamcinolone cream (KENALOG) 0.1 % Apply twice daily as needed to affected areas of the skin on the body and not the face or neck. 11/30/21   Theadora Rama Scales, PA-C    Family History Family History  Family history unknown: Yes    Social History Social History   Tobacco Use   Smoking status: Former    Types: Cigarettes, Cigars    Passive exposure: Never   Smokeless tobacco: Never  Vaping Use   Vaping status: Some Days  Substance Use Topics   Alcohol use: Yes    Comment: barely   Drug use: Not Currently    Types: Marijuana     Allergies   Patient has no known allergies.   Review of Systems Review of Systems  Gastrointestinal:  Positive for diarrhea and nausea.     Physical Exam Triage Vital Signs ED Triage Vitals [05/21/23 1430]  Encounter Vitals Group     BP 106/70     Systolic BP Percentile      Diastolic BP Percentile      Pulse Rate 84     Resp 16     Temp 98 F (36.7 C)     Temp Source Oral     SpO2 97 %     Weight      Height      Head Circumference      Peak Flow      Pain Score 7     Pain Loc      Pain Education      Exclude from Growth Chart    No data found.  Updated Vital Signs BP 106/70 (BP Location: Right Arm)   Pulse 84   Temp 98 F (36.7 C) (Oral)   Resp 16   SpO2 97%   Visual Acuity Right Eye Distance:    Left Eye Distance:   Bilateral Distance:    Right Eye Near:   Left Eye Near:    Bilateral Near:     Physical Exam Vitals and nursing note reviewed.  Constitutional:      General: He is not in acute distress.    Appearance: Normal appearance. He is not ill-appearing or toxic-appearing.  HENT:     Head: Normocephalic and atraumatic.     Right Ear: Tympanic membrane and ear canal normal.     Left Ear: Tympanic membrane and ear canal normal.     Nose: No congestion.     Mouth/Throat:     Mouth: Mucous membranes are moist.     Pharynx: No posterior oropharyngeal erythema.  Eyes:     Pupils: Pupils are equal, round, and reactive to light.  Cardiovascular:     Rate and Rhythm: Normal rate and regular rhythm.     Heart sounds: Normal heart sounds.  Pulmonary:     Effort: Pulmonary effort is normal.     Breath sounds: Normal breath sounds.  Abdominal:     General: Abdomen is flat. Bowel sounds are increased. There is no distension.     Palpations: Abdomen is soft. There is no hepatomegaly or splenomegaly.     Tenderness: There is generalized abdominal tenderness. There is no guarding. Negative signs include Rovsing's sign and McBurney's sign.  Musculoskeletal:     Cervical back: Normal range of motion and neck supple.  Lymphadenopathy:     Cervical: No cervical adenopathy.  Skin:    General: Skin is warm and dry.  Neurological:     General: No focal deficit present.     Mental Status: He is alert and oriented to person, place, and time.  Psychiatric:        Mood and Affect: Mood normal.        Behavior: Behavior normal.      UC Treatments / Results  Labs (all labs ordered are listed, but only abnormal results are displayed) Labs Reviewed  POC COVID19/FLU A&B COMBO    EKG   Radiology No results found.  Procedures Procedures (including critical care time)  Medications Ordered in UC Medications - No data to display  Initial Impression / Assessment and Plan /  UC Course  I have reviewed the triage vital signs and the nursing notes.  Pertinent labs & imaging results that were available during my care of  the patient were reviewed by me and considered in my medical decision making (see chart for details).     Reviewed exam and symptoms with patient.  No red flags.  Negative rapid COVID and flu testing.  Discussed likely viral illness and symptomatic treatment.  Discussed brat diet/hydration/electrolyte replacement.  OTC Imodium as needed.  PCP follow-up as symptoms do not improve.  ER precautions reviewed. Final Clinical Impressions(s) / UC Diagnoses   Final diagnoses:  Diarrhea, unspecified type     Discharge Instructions      Eat a bland diet such as bananas, rice, applesauce, toast.  Stay hydrated with Gatorade, Powerade, Pedialyte, water.  You may use over-the-counter Imodium as needed.  Please follow-up with your PCP if your symptoms do not improve.  Please go to the ER for any worsening symptoms.  I hope you feel better soon!     ED Prescriptions   None    PDMP not reviewed this encounter.   Radford Pax, NP 05/21/23 [phone]

## 2023-05-21 NOTE — ED Triage Notes (Addendum)
Pt presents to UC w/ c/o abd pain, diarrhea since last night. Reports fatigue. Exposure to sick uncle. Reports for the past week, he has felt a "knot in his stomach."

## 2023-06-20 ENCOUNTER — Emergency Department (HOSPITAL_BASED_OUTPATIENT_CLINIC_OR_DEPARTMENT_OTHER)
Admission: EM | Admit: 2023-06-20 | Discharge: 2023-06-20 | Disposition: A | Discharge status: Home / Self Care | Payer: Commercial Managed Care - PPO | Attending: Emergency Medicine | Admitting: Emergency Medicine

## 2023-06-20 ENCOUNTER — Encounter (HOSPITAL_BASED_OUTPATIENT_CLINIC_OR_DEPARTMENT_OTHER): Payer: Self-pay | Admitting: *Deleted

## 2023-06-20 ENCOUNTER — Other Ambulatory Visit: Payer: Self-pay

## 2023-06-20 DIAGNOSIS — R202 Paresthesia of skin: Secondary | ICD-10-CM | POA: Insufficient documentation

## 2023-06-20 DIAGNOSIS — Z87891 Personal history of nicotine dependence: Secondary | ICD-10-CM | POA: Diagnosis not present

## 2023-06-20 DIAGNOSIS — R002 Palpitations: Secondary | ICD-10-CM | POA: Diagnosis not present

## 2023-06-20 DIAGNOSIS — F419 Anxiety disorder, unspecified: Secondary | ICD-10-CM | POA: Insufficient documentation

## 2023-06-20 LAB — CBG MONITORING, ED: Glucose-Capillary: 104 mg/dL — ABNORMAL HIGH (ref 70–99)

## 2023-06-20 NOTE — Discharge Instructions (Addendum)
It was a pleasure caring for you today in the emergency department.  Your blood glucose and EKG looked okay. Recommend that you get plenty of rest over the next few days and drink plenty of fluids. Please avoid stimulants such as energy drinks in excess, tobacco products, illicit drugs, THC products. Please follow up with primary care doctor.  Please return to the emergency department for any worsening or worrisome symptoms.

## 2023-06-20 NOTE — ED Triage Notes (Signed)
Pt had Popeyes earlier tonight "that didn't look right" About 30 mins after eating reports palpitations, R leg pain, and nausea. Denies emesis, no pain at present.

## 2023-06-20 NOTE — ED Provider Notes (Signed)
[place] [facility] AT [facility] Provider Note  CSN: [id number] Arrival date & time: 06/20/23 0102  Chief Complaint(s) No chief complaint on file.  HPI Kyle Terry is a 32 y.o. male with past medical history as below, significant for appendectomy who presents to the ED with complaint of abnormal sensation after eating fried chicken  Patient reports that he had a long day at work so he ordered [employer] chicken.  Reports the delivery driver seemed strange and not trustworthy, he was concerned that his portions of food were not as expected and the food was cold.  He ate most of the food.  Around 30 minutes after eating he felt like he was having some palpitations, tingling to his fingers and toes.  Felt nauseated and lightheaded.  Felt very anxious, felt like he was going to have a panic attack.  Symptoms lasted for around 30-45 minutes and have been improving since then.  He denies any chest pain, no ongoing palpitations or dyspnea.  Denies any illicit drug use or alcohol use with his dinner tonight.  No sick contacts, no similar symptoms in the past reported  Past Medical History History reviewed. No pertinent past medical history. There are no problems to display for this patient.  Home Medication(s) Prior to Admission medications   Medication Sig Start Date End Date Taking? Authorizing Provider  albuterol (VENTOLIN HFA) 108 (90 Base) MCG/ACT inhaler Inhale 1-2 puffs into the lungs every 6 (six) hours as needed for wheezing or shortness of breath. 12/04/22   Radford Pax, NP  benzonatate (TESSALON) 200 MG capsule Take 1 capsule (200 mg total) by mouth 3 (three) times daily as needed for cough. 12/04/22   Radford Pax, NP  cetirizine (ZYRTEC) 10 MG tablet Take 1 tablet (10 mg total) by mouth daily. 06/05/22   Gustavus Bryant, FNP  Dapsone 5 % topical gel Apply 1 application. topically 2 (two) times daily. 11/30/21   Theadora Rama Scales, PA-C  fluticasone  (FLONASE) 50 MCG/ACT nasal spray Place 1 spray into both nostrils daily. 06/05/22   Gustavus Bryant, FNP  lidocaine (XYLOCAINE) 2 % solution Use as directed 15 mLs in the mouth or throat as needed for mouth pain. 06/27/22   Honor Loh M, PA-C  montelukast (SINGULAIR) 10 MG tablet Take 1 tablet (10 mg total) by mouth at bedtime. 11/30/21 05/29/22  Theadora Rama Scales, PA-C  olopatadine (PATADAY) 0.1 % ophthalmic solution Place 1 drop into the left eye 2 (two) times daily as needed for allergies. 01/24/22   Zenia Resides, MD  ondansetron (ZOFRAN-ODT) 4 MG disintegrating tablet Take 1 tablet (4 mg total) by mouth every 8 (eight) hours as needed for nausea or vomiting. 03/31/23   Radford Pax, NP  triamcinolone (KENALOG) 0.025 % cream Apply 1 application. topically 2 (two) times daily. Apply to affected areas of face and neck twice daily as needed for rash. 11/30/21   Theadora Rama Scales, PA-C  triamcinolone cream (KENALOG) 0.1 % Apply twice daily as needed to affected areas of the skin on the body and not the face or neck. 11/30/21   Theadora Rama Scales, PA-C  Past Surgical History Past Surgical History:  Procedure Laterality Date   APPENDECTOMY     Family History Family History  Family history unknown: Yes    Social History Social History   Tobacco Use   Smoking status: Former    Types: Cigarettes, Cigars    Passive exposure: Never   Smokeless tobacco: Never  Vaping Use   Vaping status: Some Days  Substance Use Topics   Alcohol use: Yes    Comment: barely   Drug use: Not Currently    Types: Marijuana   Allergies Patient has no known allergies.  Review of Systems Review of Systems  Constitutional:  Negative for chills and fever.  Respiratory:  Negative for shortness of breath.   Cardiovascular:  Positive for palpitations.   Gastrointestinal:  Positive for nausea. Negative for abdominal pain.  Skin:  Negative for rash.  Neurological:  Negative for syncope and numbness.  Psychiatric/Behavioral:  The patient is nervous/anxious.   All other systems reviewed and are negative.   Physical Exam Vital Signs  I have reviewed the triage vital signs BP 138/79   Pulse 89   Temp 98 F (36.7 C)   Resp 17   SpO2 100%  Physical Exam Vitals and nursing note reviewed.  Constitutional:      General: He is not in acute distress.    Appearance: He is well-developed.     Comments: Odor of marijuana present   HENT:     Head: Normocephalic and atraumatic.     Right Ear: External ear normal.     Left Ear: External ear normal.     Mouth/Throat:     Mouth: Mucous membranes are moist.  Eyes:     General: No scleral icterus. Cardiovascular:     Rate and Rhythm: Normal rate and regular rhythm.     Pulses: Normal pulses.     Heart sounds: Normal heart sounds.  Pulmonary:     Effort: Pulmonary effort is normal. No respiratory distress.     Breath sounds: Normal breath sounds.  Abdominal:     General: Abdomen is flat.     Palpations: Abdomen is soft.     Tenderness: There is no abdominal tenderness.  Musculoskeletal:     Cervical back: No rigidity.     Right lower leg: No edema.     Left lower leg: No edema.  Skin:    General: Skin is warm and dry.     Capillary Refill: Capillary refill takes less than 2 seconds.  Neurological:     Mental Status: He is alert and oriented to person, place, and time.     GCS: GCS eye subscore is 4. GCS verbal subscore is 5. GCS motor subscore is 6.  Psychiatric:        Mood and Affect: Mood is anxious.        Behavior: Behavior is hyperactive.     ED Results and Treatments Labs (all labs ordered are listed, but only abnormal results are displayed) Labs Reviewed  CBG MONITORING, ED - Abnormal; Notable for the following components:      Result Value   Glucose-Capillary 104  (*)    All other components within normal limits  Radiology No results found.  Pertinent labs & imaging results that were available during my care of the patient were reviewed by me and considered in my medical decision making (see MDM for details).  Medications Ordered in ED Medications - No data to display                                                                                                                                   Procedures Procedures  (including critical care time)  Medical Decision Making / ED Course    Medical Decision Making:    Kyle Terry is a 32 y.o. male  with past medical history as below, significant for appendectomy who presents to the ED with complaint of abnormal sensation after eating fried chicken. The complaint involves an extensive differential diagnosis and also carries with it a high risk of complications and morbidity.  Serious etiology was considered. Ddx includes but is not limited to: Arrhythmia, acs, gastroenteritis, psychosomatic, illicit drug use, etc  Complete initial physical exam performed, notably the patient was in no acute distress, sitting upright on stretcher, no hypoxia or tachycardia.    Reviewed and confirmed nursing documentation for past medical history, family history, social history.  Vital signs reviewed.         Brief summary: 32 year old male here for multiple complaints after eating [employer] chicken.  Anxiousness, concern for panic attack, palpitations, tingling to fingertips, nausea.  Symptoms have improved since the onset without intervention.  CBG was stable, EKG with early repol, no STEMI.  Symptoms have improved without intervention.  Patient denies illicit drug use, alcohol or excessive use of stimulants.  Odor of marijuana is present in the room with the patient.  His presentation  is reassuring, exam is reassuring.  Symptoms improved without intervention.  No other acute interventions seem medically necessary at this time.  Symptoms may be secondary to anxiety or panic attack, possibly provoked by [facility] use.  Advised him to refrain from illicit drugs, follow-up PCP  The patient improved significantly and was discharged in stable condition. Detailed discussions were had with the patient regarding current findings, and need for close f/u with PCP or on call doctor. The patient has been instructed to return immediately if the symptoms worsen in any way for re-evaluation. Patient verbalized understanding and is in agreement with current care plan. All questions answered prior to discharge.                  Additional history obtained: -Additional history obtained from na -External records from outside source obtained and reviewed including: Chart review including previous notes, labs, imaging, consultation notes including  Primary care documentation, prior labs, PDMP   Lab Tests: -I ordered, reviewed, and interpreted labs.   The pertinent results include:   Labs Reviewed  CBG MONITORING, ED - Abnormal; Notable for the following components:      Result Value   Glucose-Capillary 104 (*)  All other components within normal limits    Notable for cbg stable  EKG   EKG Interpretation Date/Time:    Ventricular Rate:    PR Interval:    QRS Duration:    QT Interval:    QTC Calculation:   R Axis:      Text Interpretation:           Imaging Studies ordered: na   Medicines ordered and prescription drug management: No orders of the defined types were placed in this encounter.   -I have reviewed the patients home medicines and have made adjustments as needed   Consultations Obtained: na   Cardiac Monitoring: The patient was maintained on a cardiac monitor.  I personally viewed and interpreted the cardiac monitored which showed an underlying  rhythm of: NSR Continuous pulse oximetry interpreted by myself, 99% on RA.    Social Determinants of Health:  Diagnosis or treatment significantly limited by social determinants of health: former smoker   Reevaluation: After the interventions noted above, I reevaluated the patient and found that they have improved  Co morbidities that complicate the patient evaluation History reviewed. No pertinent past medical history.    Dispostion: Disposition decision including need for hospitalization was considered, and patient discharged from emergency department.    Final Clinical Impression(s) / ED Diagnoses Final diagnoses:  Palpitations  Anxiousness        Sloan Leiter, DO 06/20/23 0207

## 2023-08-22 ENCOUNTER — Ambulatory Visit
Admission: EM | Admit: 2023-08-22 | Discharge: 2023-08-22 | Disposition: A | Discharge status: Home / Self Care | Payer: Commercial Managed Care - PPO | Attending: Family Medicine | Admitting: Family Medicine

## 2023-08-22 DIAGNOSIS — L209 Atopic dermatitis, unspecified: Secondary | ICD-10-CM | POA: Diagnosis not present

## 2023-08-22 DIAGNOSIS — B349 Viral infection, unspecified: Secondary | ICD-10-CM | POA: Diagnosis not present

## 2023-08-22 MED ORDER — TRIAMCINOLONE ACETONIDE 0.025 % EX OINT: 1.0000 | TOPICAL_OINTMENT | Freq: Two times a day (BID) | CUTANEOUS | 0 refills | Status: DC

## 2023-08-22 MED ORDER — FLUTICASONE PROPIONATE 50 MCG/ACT NA SUSP: 1.0000 | Freq: Every day | NASAL | 0 refills | Status: DC

## 2023-08-22 MED ORDER — PROMETHAZINE-DM 6.25-15 MG/5ML PO SYRP: 5.0000 mL | ORAL_SOLUTION | Freq: Four times a day (QID) | ORAL | 0 refills | Status: DC | PRN

## 2023-08-22 NOTE — Discharge Instructions (Addendum)
 You may take promethazine  DM as needed for cough.  Please note this medication make you drowsy.  Do not drink alcohol or drive while on this medication.  Flonase  daily to help with your postnasal drip.  Lots of rest and fluids.  I have refilled your triamcinolone  topical steroid cream to use on your neck as needed for your rash.  Follow-up with your PCP in 2 to 3 days for recheck.  Please go to the ER for any worsening symptoms.  Hope you feel better soon!

## 2023-08-22 NOTE — ED Provider Notes (Addendum)
 [facility]    CSN: [id number] Arrival date & time: 08/22/23  9148      History   Chief Complaint Chief Complaint  Patient presents with   Cough    HPI Kyle Terry is a 33 y.o. male  presents for evaluation of URI symptoms for 1 days. Patient reports associated symptoms of cough, congestion, chills, tactile fevers, runny nose, postnasal drip. Denies N/V/D, documented fevers, sore throat, ear pain, body aches, shortness of breath. Patient does not have a hx of asthma. Patient does vape.   Reports grandfather had similar symptoms.  Pt has taken mucinex and TheraFlu OTC for symptoms.  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  Although he tries to keep it covered it still happens causing him to develop a bumpy rash.  He has been treated before in the past the triamcinolone  that was affected but he no longer has the cream and is requesting a refill of this.  Pt has no other concerns at this time.     Cough Associated symptoms: chills and rhinorrhea     History reviewed. No pertinent past medical history.  There are no active problems to display for this patient.   Past Surgical History:  Procedure Laterality Date   APPENDECTOMY         Home Medications    Prior to Admission medications   Medication Sig Start Date End Date Taking? Authorizing Provider  fluticasone  (FLONASE ) 50 MCG/ACT nasal spray Place 1 spray into both nostrils daily. 08/22/23  Yes Luciana Cammarata, Jodi R, NP  promethazine -dextromethorphan (PROMETHAZINE -DM) 6.25-15 MG/5ML syrup Take 5 mLs by mouth 4 (four) times daily as needed for cough. 08/22/23  Yes Howie Rufus, Jodi R, NP  triamcinolone  (KENALOG ) 0.025 % ointment Apply 1 Application topically 2 (two) times daily. 08/22/23  Yes Nataleigh Griffin, Jodi R, NP  albuterol  (VENTOLIN  HFA) 108 (90 Base) MCG/ACT inhaler Inhale 1-2 puffs into the lungs every 6 (six) hours as needed for wheezing or shortness of breath. 12/04/22    Faith Patricelli, Jodi R, NP  benzonatate  (TESSALON ) 200 MG capsule Take 1 capsule (200 mg total) by mouth 3 (three) times daily as needed for cough. 12/04/22   Jaedyn Marrufo, Jodi R, NP  cetirizine  (ZYRTEC ) 10 MG tablet Take 1 tablet (10 mg total) by mouth daily. 06/05/22   Hazen Darryle BRAVO, FNP  Dapsone  5 % topical gel Apply 1 application. topically 2 (two) times daily. 11/30/21   Joesph Shaver Scales, PA-C  lidocaine  (XYLOCAINE ) 2 % solution Use as directed 15 mLs in the mouth or throat as needed for mouth pain. 06/27/22   Theotis Peers M, PA-C  montelukast  (SINGULAIR ) 10 MG tablet Take 1 tablet (10 mg total) by mouth at bedtime. 11/30/21 05/29/22  Joesph Shaver Scales, PA-C  olopatadine  (PATADAY ) 0.1 % ophthalmic solution Place 1 drop into the left eye 2 (two) times daily as needed for allergies. 01/24/22   Vonna Sharlet POUR, MD  ondansetron  (ZOFRAN -ODT) 4 MG disintegrating tablet Take 1 tablet (4 mg total) by mouth every 8 (eight) hours as needed for nausea or vomiting. 03/31/23   Loreda Myla SAUNDERS, NP    Family History Family History  Family history unknown: Yes    Social History Social History   Tobacco Use   Smoking status: Former    Types: Cigarettes, Cigars    Passive exposure: Never   Smokeless tobacco: Never  Vaping Use   Vaping status: Some Days  Substance Use  Topics   Alcohol use: Yes    Comment: barely   Drug use: Not Currently    Types: Marijuana     Allergies   Patient has no known allergies.   Review of Systems Review of Systems  Constitutional:  Positive for chills.  HENT:  Positive for congestion, postnasal drip and rhinorrhea.   Respiratory:  Positive for cough.      Physical Exam Triage Vital Signs ED Triage Vitals  Encounter Vitals Group     BP 08/22/23 0908 104/71     Systolic BP Percentile --      Diastolic BP Percentile --      Pulse Rate 08/22/23 0908 68     Resp 08/22/23 0908 16     Temp 08/22/23 0908 98.2 F (36.8 C)     Temp Source 08/22/23 0908 Oral      SpO2 08/22/23 0908 97 %     Weight --      Height --      Head Circumference --      Peak Flow --      Pain Score 08/22/23 0905 0     Pain Loc --      Pain Education --      Exclude from Growth Chart --    No data found.  Updated Vital Signs BP 104/71 (BP Location: Right Arm)   Pulse 68   Temp 98.2 F (36.8 C) (Oral)   Resp 16   SpO2 97%   Visual Acuity Right Eye Distance:   Left Eye Distance:   Bilateral Distance:    Right Eye Near:   Left Eye Near:    Bilateral Near:     Physical Exam Vitals and nursing note reviewed.  Constitutional:      General: He is not in acute distress.    Appearance: Normal appearance. He is not ill-appearing or toxic-appearing.  HENT:     Head: Normocephalic and atraumatic.     Right Ear: Tympanic membrane and ear canal normal.     Left Ear: Tympanic membrane and ear canal normal.     Nose: Congestion present.     Mouth/Throat:     Mouth: Mucous membranes are moist.     Pharynx: Postnasal drip present. No oropharyngeal exudate or posterior oropharyngeal erythema.  Eyes:     Pupils: Pupils are equal, round, and reactive to light.  Cardiovascular:     Rate and Rhythm: Normal rate and regular rhythm.     Heart sounds: Normal heart sounds.  Pulmonary:     Effort: Pulmonary effort is normal.     Breath sounds: Normal breath sounds.  Musculoskeletal:     Cervical back: Normal range of motion and neck supple.  Lymphadenopathy:     Cervical: No cervical adenopathy.  Skin:    General: Skin is warm and dry.     Findings: Rash is not crusting, nodular, purpuric, pustular, scaling, urticarial or vesicular.          Comments: Very fine macular papular rash on right neck.  No drainage, swelling, warmth.  Neurological:     General: No focal deficit present.     Mental Status: He is alert and oriented to person, place, and time.  Psychiatric:        Mood and Affect: Mood normal.        Behavior: Behavior normal.      UC Treatments /  Results  Labs (all labs ordered are listed, but only abnormal results are  displayed) Labs Reviewed - No data to display  EKG   Radiology No results found.  Procedures Procedures (including critical care time)  Medications Ordered in UC Medications - No data to display  Initial Impression / Assessment and Plan / UC Course  I have reviewed the triage vital signs and the nursing notes.  Pertinent labs & imaging results that were available during my care of the patient were reviewed by me and considered in my medical decision making (see chart for details).     Reviewed exam and sx with patient.  No red flags.  Patient declined COVID or flu testing.  Discussed viral illness and symptomatic treatment.  Promethazine  DM as needed for cough, side effect profile reviewed.  Flonase  daily.  Refilled triamcinolone  for rash as needed.  PCP follow-up 2 to 3 days for recheck.  ER precautions reviewed and patient verbalized understanding. Final Clinical Impressions(s) / UC Diagnoses   Final diagnoses:  Viral illness  Atopic dermatitis, unspecified type     Discharge Instructions      You may take promethazine  DM as needed for cough.  Please note this medication make you drowsy.  Do not drink alcohol or drive while on this medication.  Flonase  daily to help with your postnasal drip.  Lots of rest and fluids.  I have refilled your triamcinolone  topical steroid cream to use on your neck as needed for your rash.  Follow-up with your PCP in 2 to 3 days for recheck.  Please go to the ER for any worsening symptoms.  Hope you feel better soon!    ED Prescriptions     Medication Sig Dispense Auth. Provider   promethazine -dextromethorphan (PROMETHAZINE -DM) 6.25-15 MG/5ML syrup Take 5 mLs by mouth 4 (four) times daily as needed for cough. 118 mL Shadiamond Koska, Jodi R, NP   fluticasone  (FLONASE ) 50 MCG/ACT nasal spray Place 1 spray into both nostrils daily. 15.8 mL Sevyn Markham, Jodi R, NP   triamcinolone   (KENALOG ) 0.025 % ointment Apply 1 Application topically 2 (two) times daily. 80 g Dereonna Lensing, Jodi R, NP      PDMP not reviewed this encounter.   Loreda Myla SAUNDERS, NP 08/22/23 0930    Loreda Myla SAUNDERS, NP 08/22/23 [phone]

## 2023-08-22 NOTE — ED Triage Notes (Signed)
 Pt presents to UC for c/o runny nose, cough, congestion, chills and feeling hot starting yesterday. Has used mucinex and theraflu

## 2023-10-23 ENCOUNTER — Ambulatory Visit (INDEPENDENT_AMBULATORY_CARE_PROVIDER_SITE_OTHER)

## 2023-10-23 ENCOUNTER — Ambulatory Visit
Admission: EM | Admit: 2023-10-23 | Discharge: 2023-10-23 | Disposition: A | Discharge status: Home / Self Care | Attending: Family Medicine | Admitting: Family Medicine

## 2023-10-23 DIAGNOSIS — R0602 Shortness of breath: Secondary | ICD-10-CM

## 2023-10-23 DIAGNOSIS — Z7289 Other problems related to lifestyle: Secondary | ICD-10-CM

## 2023-10-23 MED ORDER — ALBUTEROL SULFATE HFA 108 (90 BASE) MCG/ACT IN AERS: 1.0000 | INHALATION_SPRAY | Freq: Four times a day (QID) | RESPIRATORY_TRACT | 0 refills | Status: AC | PRN

## 2023-10-23 NOTE — Discharge Instructions (Signed)
 You may use the albuterol inhaler as needed for any shortness of breath.  Stop vaping as this is likely contributing to your symptoms.  Please follow-up with your PCP if your symptoms do not improve.  Please go to the ER if you develop any worsening symptoms.  Hope you feel better soon!

## 2023-10-23 NOTE — ED Triage Notes (Signed)
 Pt presents with c/o sob and chest congestion  Pt states he has been vaping more often lately and is concerned his breathing is not normal. Pt states he is not able to take deep breathes. Pt report he has been vaping for months.

## 2023-10-23 NOTE — ED Provider Notes (Signed)
 [facility]    CSN: [id number] Arrival date & time: 10/23/23  1438      History   Chief Complaint Chief Complaint  Patient presents with   Shortness of Breath    HPI Kyle Terry is a 33 y.o. male presents for shortness of breath and chest congestion/tightness.  Patient reports he is a previous cigarette smoker and does currently vape tobacco only.  States he has been vaping more recently and has noticed some shortness of breath when he vapes as well as chest tightness.  States the symptoms resolved once he stops vaping.  Denies any URI symptoms such as cough cold, sore throat.  Denies any asthma history.  States he stopped vaping as of today.  No other concerns at this time.   Shortness of Breath   History reviewed. No pertinent past medical history.  There are no active problems to display for this patient.   Past Surgical History:  Procedure Laterality Date   APPENDECTOMY         Home Medications    Prior to Admission medications   Medication Sig Start Date End Date Taking? Authorizing Provider  albuterol (VENTOLIN HFA) 108 (90 Base) MCG/ACT inhaler Inhale 1-2 puffs into the lungs every 6 (six) hours as needed. 10/23/23  Yes Radford Pax, NP  benzonatate (TESSALON) 200 MG capsule Take 1 capsule (200 mg total) by mouth 3 (three) times daily as needed for cough. 12/04/22   Radford Pax, NP  cetirizine (ZYRTEC) 10 MG tablet Take 1 tablet (10 mg total) by mouth daily. 06/05/22   Gustavus Bryant, FNP  Dapsone 5 % topical gel Apply 1 application. topically 2 (two) times daily. 11/30/21   Theadora Rama Scales, PA-C  fluticasone (FLONASE) 50 MCG/ACT nasal spray Place 1 spray into both nostrils daily. 08/22/23   Radford Pax, NP  lidocaine (XYLOCAINE) 2 % solution Use as directed 15 mLs in the mouth or throat as needed for mouth pain. 06/27/22   Honor Loh M, PA-C  montelukast (SINGULAIR) 10 MG tablet Take 1 tablet (10 mg total) by mouth at bedtime.  11/30/21 05/29/22  Theadora Rama Scales, PA-C  olopatadine (PATADAY) 0.1 % ophthalmic solution Place 1 drop into the left eye 2 (two) times daily as needed for allergies. 01/24/22   Zenia Resides, MD  ondansetron (ZOFRAN-ODT) 4 MG disintegrating tablet Take 1 tablet (4 mg total) by mouth every 8 (eight) hours as needed for nausea or vomiting. 03/31/23   Radford Pax, NP  promethazine-dextromethorphan (PROMETHAZINE-DM) 6.25-15 MG/5ML syrup Take 5 mLs by mouth 4 (four) times daily as needed for cough. 08/22/23   Radford Pax, NP  triamcinolone (KENALOG) 0.025 % ointment Apply 1 Application topically 2 (two) times daily. 08/22/23   Radford Pax, NP    Family History Family History  Family history unknown: Yes    Social History Social History   Tobacco Use   Smoking status: Former    Types: Cigarettes, Cigars    Passive exposure: Never   Smokeless tobacco: Never  Vaping Use   Vaping status: Every Day  Substance Use Topics   Alcohol use: Yes    Comment: barely   Drug use: Not Currently    Types: Marijuana     Allergies   Patient has no known allergies.   Review of Systems Review of Systems  Respiratory:  Positive for chest tightness and shortness of breath.      Physical Exam Triage Vital Signs  ED Triage Vitals  Encounter Vitals Group     BP 10/23/23 1514 122/80     Systolic BP Percentile --      Diastolic BP Percentile --      Pulse Rate 10/23/23 1513 73     Resp 10/23/23 1513 16     Temp 10/23/23 1514 98.2 F (36.8 C)     Temp Source 10/23/23 1514 Oral     SpO2 10/23/23 1513 97 %     Weight --      Height --      Head Circumference --      Peak Flow --      Pain Score 10/23/23 1513 5     Pain Loc --      Pain Education --      Exclude from Growth Chart --    No data found.  Updated Vital Signs BP 122/80   Pulse 73   Temp 98.2 F (36.8 C) (Oral)   Resp 16   SpO2 97%   Visual Acuity Right Eye Distance:   Left Eye Distance:   Bilateral  Distance:    Right Eye Near:   Left Eye Near:    Bilateral Near:     Physical Exam Vitals and nursing note reviewed.  Constitutional:      General: He is not in acute distress.    Appearance: Normal appearance. He is not ill-appearing or toxic-appearing.  HENT:     Head: Normocephalic and atraumatic.     Nose: No congestion.     Mouth/Throat:     Mouth: Mucous membranes are moist.  Eyes:     Pupils: Pupils are equal, round, and reactive to light.  Cardiovascular:     Rate and Rhythm: Normal rate and regular rhythm.     Heart sounds: Normal heart sounds.  Pulmonary:     Effort: Pulmonary effort is normal.     Breath sounds: Normal breath sounds. No wheezing or rhonchi.  Musculoskeletal:     Cervical back: Normal range of motion and neck supple.  Lymphadenopathy:     Cervical: No cervical adenopathy.  Skin:    General: Skin is warm and dry.  Neurological:     General: No focal deficit present.     Mental Status: He is alert and oriented to person, place, and time.  Psychiatric:        Mood and Affect: Mood normal.        Behavior: Behavior normal.      UC Treatments / Results  Labs (all labs ordered are listed, but only abnormal results are displayed) Labs Reviewed - No data to display  EKG   Radiology No results found.  Procedures Procedures (including critical care time)  Medications Ordered in UC Medications - No data to display  Initial Impression / Assessment and Plan / UC Course  I have reviewed the triage vital signs and the nursing notes.  Pertinent labs & imaging results that were available during my care of the patient were reviewed by me and considered in my medical decision making (see chart for details).     Reviewed exam and symptoms with patient.  No red flags.  Wet read of chest x-ray without any abnormalities, will contact for any positive results based on radiology overread.  Discussed symptoms likely related to his vaping.  Rx for  albuterol inhaler to use as needed.  Advised to stop vaping.  PCP follow-up if symptoms do not improve.  ER precautions reviewed  and patient verbalized understanding. Final Clinical Impressions(s) / UC Diagnoses   Final diagnoses:  Shortness of breath  Engages in vaping     Discharge Instructions      You may use the albuterol inhaler as needed for any shortness of breath.  Stop vaping as this is likely contributing to your symptoms.  Please follow-up with your PCP if your symptoms do not improve.  Please go to the ER if you develop any worsening symptoms.  Hope you feel better soon!     ED Prescriptions     Medication Sig Dispense Auth. Provider   albuterol (VENTOLIN HFA) 108 (90 Base) MCG/ACT inhaler Inhale 1-2 puffs into the lungs every 6 (six) hours as needed. 1 each Radford Pax, NP      PDMP not reviewed this encounter.   Radford Pax, NP 10/23/23 [phone]

## 2023-11-24 ENCOUNTER — Ambulatory Visit
Admission: EM | Admit: 2023-11-24 | Discharge: 2023-11-24 | Disposition: A | Discharge status: Home / Self Care | Attending: Family Medicine | Admitting: Family Medicine

## 2023-11-24 ENCOUNTER — Ambulatory Visit (INDEPENDENT_AMBULATORY_CARE_PROVIDER_SITE_OTHER)

## 2023-11-24 DIAGNOSIS — L84 Corns and callosities: Secondary | ICD-10-CM

## 2023-11-24 DIAGNOSIS — M79672 Pain in left foot: Secondary | ICD-10-CM

## 2023-11-24 NOTE — Discharge Instructions (Signed)
 Please contact podiatry as listed above to make an appointment for further treatment of your callus.

## 2023-11-24 NOTE — ED Triage Notes (Signed)
 Pt present with c/o intermittent lt foot pain x 3 months. States he injured his foot a while ago, forgot how he injured it. Pt states he has not taken anything for pain. C/o swelling.

## 2023-11-24 NOTE — ED Provider Notes (Signed)
 [facility]    CSN: [id number] Arrival date & time: 11/24/23  1107      History   Chief Complaint Chief Complaint  Patient presents with   Foot Pain    HPI Kyle Terry is a 33 y.o. male presents for foot pain.  Patient reports 3 months ago he injured the medial aspect of his left foot but does not recall how.  He does recall he had a scratch or cut to the area that he just allowed it to heal.  Since then he has developed a hard bump to the area that is painful with walking or when he puts his shoes on.  Denies any redness warmth drainage fevers or chills.  He has not tried to treat symptoms with any over-the-counter treatments.  No other concerns at this time.   Foot Pain    History reviewed. No pertinent past medical history.  There are no active problems to display for this patient.   Past Surgical History:  Procedure Laterality Date   APPENDECTOMY         Home Medications    Prior to Admission medications   Medication Sig Start Date End Date Taking? Authorizing Provider  albuterol  (VENTOLIN  HFA) 108 (90 Base) MCG/ACT inhaler Inhale 1-2 puffs into the lungs every 6 (six) hours as needed. 10/23/23   Ilynn Stauffer, Jodi R, NP  benzonatate  (TESSALON ) 200 MG capsule Take 1 capsule (200 mg total) by mouth 3 (three) times daily as needed for cough. 12/04/22   Garcia Dalzell, Jodi R, NP  cetirizine  (ZYRTEC ) 10 MG tablet Take 1 tablet (10 mg total) by mouth daily. 06/05/22   Dodson Freestone, FNP  Dapsone  5 % topical gel Apply 1 application. topically 2 (two) times daily. 11/30/21   Eloise Hake Scales, PA-C  fluticasone  (FLONASE ) 50 MCG/ACT nasal spray Place 1 spray into both nostrils daily. 08/22/23   Kem Hensen, Jodi R, NP  lidocaine  (XYLOCAINE ) 2 % solution Use as directed 15 mLs in the mouth or throat as needed for mouth pain. 06/27/22   Angelyn Kennel M, PA-C  montelukast  (SINGULAIR ) 10 MG tablet Take 1 tablet (10 mg total) by mouth at bedtime. 11/30/21 05/29/22  Eloise Hake Scales, PA-C  olopatadine  (PATADAY ) 0.1 % ophthalmic solution Place 1 drop into the left eye 2 (two) times daily as needed for allergies. 01/24/22   Ann Keto, MD  ondansetron  (ZOFRAN -ODT) 4 MG disintegrating tablet Take 1 tablet (4 mg total) by mouth every 8 (eight) hours as needed for nausea or vomiting. 03/31/23   Camrie Stock, Jodi R, NP  promethazine -dextromethorphan (PROMETHAZINE -DM) 6.25-15 MG/5ML syrup Take 5 mLs by mouth 4 (four) times daily as needed for cough. 08/22/23   Jonavin Seder, Jodi R, NP  triamcinolone  (KENALOG ) 0.025 % ointment Apply 1 Application topically 2 (two) times daily. 08/22/23   Alleen Arbour, NP    Family History Family History  Family history unknown: Yes    Social History Social History   Tobacco Use   Smoking status: Former    Types: Cigarettes, Cigars    Passive exposure: Never   Smokeless tobacco: Never  Vaping Use   Vaping status: Every Day  Substance Use Topics   Alcohol use: Yes    Comment: barely   Drug use: Not Currently    Types: Marijuana     Allergies   Patient has no known allergies.   Review of Systems Review of Systems  Skin:        Left foot pain  Physical Exam Triage Vital Signs ED Triage Vitals [11/24/23 1305]  Encounter Vitals Group     BP 113/74     Systolic BP Percentile      Diastolic BP Percentile      Pulse Rate 82     Resp 17     Temp 98.2 F (36.8 C)     Temp Source Oral     SpO2 98 %     Weight      Height      Head Circumference      Peak Flow      Pain Score 4     Pain Loc      Pain Education      Exclude from Growth Chart    No data found.  Updated Vital Signs BP 113/74 (BP Location: Left Arm)   Pulse 82   Temp 98.2 F (36.8 C) (Oral)   Resp 17   SpO2 98%   Visual Acuity Right Eye Distance:   Left Eye Distance:   Bilateral Distance:    Right Eye Near:   Left Eye Near:    Bilateral Near:     Physical Exam Vitals and nursing note reviewed.  Constitutional:      General: He  is not in acute distress.    Appearance: Normal appearance. He is not ill-appearing.  HENT:     Head: Normocephalic and atraumatic.  Eyes:     Pupils: Pupils are equal, round, and reactive to light.  Cardiovascular:     Rate and Rhythm: Normal rate.  Pulmonary:     Effort: Pulmonary effort is normal.  Musculoskeletal:       Feet:  Feet:     Comments: There is a callus to the medial left midfoot that is tender to palpation.  There is no swelling, drainage, warmth or erythema.  No deformity. Skin:    General: Skin is warm and dry.  Neurological:     General: No focal deficit present.     Mental Status: He is alert and oriented to person, place, and time.  Psychiatric:        Mood and Affect: Mood normal.        Behavior: Behavior normal.      UC Treatments / Results  Labs (all labs ordered are listed, but only abnormal results are displayed) Labs Reviewed - No data to display  EKG   Radiology No results found.  Procedures Procedures (including critical care time)  Medications Ordered in UC Medications - No data to display  Initial Impression / Assessment and Plan / UC Course  I have reviewed the triage vital signs and the nursing notes.  Pertinent labs & imaging results that were available during my care of the patient were reviewed by me and considered in my medical decision making (see chart for details).     Reviewed exam and symptoms with patient.  No red flags.  Wet read of x-ray without obvious abnormality or foreign body.  Discussed likely callus and will have him follow-up with podiatry for treatment.  He can do Epsom salt soaks to help soften it.  ER precautions reviewed and patient verbalized understanding. Final Clinical Impressions(s) / UC Diagnoses   Final diagnoses:  Left foot pain  Callus of foot     Discharge Instructions      Please contact podiatry as listed above to make an appointment for further treatment of your callus.   ED  Prescriptions   None  PDMP not reviewed this encounter.   Alleen Arbour, NP 11/24/23 [phone]

## 2024-01-14 ENCOUNTER — Ambulatory Visit
Admission: EM | Admit: 2024-01-14 | Discharge: 2024-01-14 | Disposition: A | Discharge status: Home / Self Care | Attending: Family Medicine | Admitting: Family Medicine

## 2024-01-14 ENCOUNTER — Other Ambulatory Visit: Payer: Self-pay

## 2024-01-14 DIAGNOSIS — K529 Noninfective gastroenteritis and colitis, unspecified: Secondary | ICD-10-CM | POA: Diagnosis not present

## 2024-01-14 LAB — POCT URINALYSIS DIP (MANUAL ENTRY)
Bilirubin, UA: NEGATIVE
Glucose, UA: NEGATIVE mg/dL
Ketones, POC UA: NEGATIVE mg/dL
Leukocytes, UA: NEGATIVE
Nitrite, UA: NEGATIVE
Protein Ur, POC: NEGATIVE mg/dL
Spec Grav, UA: 1.015 (ref 1.010–1.025)
Urobilinogen, UA: 1 U/dL
pH, UA: 6 (ref 5.0–8.0)

## 2024-01-14 MED ORDER — DICYCLOMINE HCL 20 MG PO TABS: 20.0000 mg | ORAL_TABLET | Freq: Two times a day (BID) | ORAL | 0 refills | Status: AC | PRN

## 2024-01-14 MED ORDER — ONDANSETRON 4 MG PO TBDP: 4.0000 mg | ORAL_TABLET | Freq: Three times a day (TID) | ORAL | 0 refills | Status: AC | PRN

## 2024-01-14 NOTE — ED Provider Notes (Signed)
 [facility]    CSN: [id number] Arrival date & time: 01/14/24  9173      History   Chief Complaint No chief complaint on file.   HPI Kyle Terry is a 33 y.o. male presents for nausea vomiting and abdominal pain.  Patient reports last night he had some questionable meatloaf from his friend's house.  States he woke around 2 AM with lower abdominal cramping as well as nausea and nonbloody diarrhea.  States he feels like he has to throw up but he will not allow himself to do so.  No fevers or chills.  No dysuria or URI symptoms.  No history of GI diagnoses such as Crohn's, IBS, colitis.  He has not taken any OTC treatments for symptoms.  Has a history of appendectomy.  No other concerns at this time.  HPI  History reviewed. No pertinent past medical history.  There are no active problems to display for this patient.   Past Surgical History:  Procedure Laterality Date   APPENDECTOMY         Home Medications    Prior to Admission medications   Medication Sig Start Date End Date Taking? Authorizing Provider  dicyclomine (BENTYL) 20 MG tablet Take 1 tablet (20 mg total) by mouth 2 (two) times daily as needed (abdominal cramping). 01/14/24  Yes Joscelyn Hardrick, Jodi R, NP  ondansetron  (ZOFRAN -ODT) 4 MG disintegrating tablet Take 1 tablet (4 mg total) by mouth every 8 (eight) hours as needed for nausea or vomiting. 01/14/24  Yes Piero Mustard, Jodi R, NP  albuterol  (VENTOLIN  HFA) 108 (90 Base) MCG/ACT inhaler Inhale 1-2 puffs into the lungs every 6 (six) hours as needed. 10/23/23   Niamh Rada, Jodi R, NP  cetirizine  (ZYRTEC ) 10 MG tablet Take 1 tablet (10 mg total) by mouth daily. 06/05/22   Hazen Darryle BRAVO, FNP    Family History Family History  Family history unknown: Yes    Social History Social History   Tobacco Use   Smoking status: Some Days    Types: Cigars    Passive exposure: Never   Smokeless tobacco: Never  Vaping Use   Vaping status: Every Day  Substance Use Topics    Alcohol use: Yes    Comment: barely   Drug use: Not Currently    Types: Marijuana     Allergies   Patient has no known allergies.   Review of Systems Review of Systems  Gastrointestinal:  Positive for abdominal pain, diarrhea and nausea.     Physical Exam Triage Vital Signs ED Triage Vitals  Encounter Vitals Group     BP 01/14/24 0836 123/79     Girls Systolic BP Percentile --      Girls Diastolic BP Percentile --      Boys Systolic BP Percentile --      Boys Diastolic BP Percentile --      Pulse Rate 01/14/24 0836 (!) 57     Resp 01/14/24 0836 16     Temp 01/14/24 0836 (!) 97.5 F (36.4 C)     Temp Source 01/14/24 0836 Oral     SpO2 01/14/24 0836 98 %     Weight --      Height --      Head Circumference --      Peak Flow --      Pain Score 01/14/24 0833 6     Pain Loc --      Pain Education --      Exclude from [employer]  Chart --    No data found.  Updated Vital Signs BP 123/79   Pulse (!) 57   Temp (!) 97.5 F (36.4 C) (Oral)   Resp 16   SpO2 98%   Visual Acuity Right Eye Distance:   Left Eye Distance:   Bilateral Distance:    Right Eye Near:   Left Eye Near:    Bilateral Near:     Physical Exam Vitals and nursing note reviewed.  Constitutional:      General: He is not in acute distress.    Appearance: Normal appearance. He is not ill-appearing, toxic-appearing or diaphoretic.  HENT:     Head: Normocephalic and atraumatic.  Eyes:     Pupils: Pupils are equal, round, and reactive to light.  Cardiovascular:     Rate and Rhythm: Bradycardia present.     Comments: Heart rate 57 Pulmonary:     Effort: Pulmonary effort is normal.  Abdominal:     General: Abdomen is flat. Bowel sounds are increased.     Palpations: Abdomen is soft. There is no fluid wave, hepatomegaly or splenomegaly.     Tenderness: There is generalized abdominal tenderness. Negative signs include McBurney's sign.  Skin:    General: Skin is warm and dry.  Neurological:      General: No focal deficit present.     Mental Status: He is alert and oriented to person, place, and time.  Psychiatric:        Mood and Affect: Mood normal.        Behavior: Behavior normal.      UC Treatments / Results  Labs (all labs ordered are listed, but only abnormal results are displayed) Labs Reviewed  POCT URINALYSIS DIP (MANUAL ENTRY) - Abnormal; Notable for the following components:      Result Value   Color, UA light yellow (*)    Blood, UA moderate (*)    All other components within normal limits    EKG   Radiology No results found.  Procedures Procedures (including critical care time)  Medications Ordered in UC Medications - No data to display  Initial Impression / Assessment and Plan / UC Course  I have reviewed the triage vital signs and the nursing notes.  Pertinent labs & imaging results that were available during my care of the patient were reviewed by me and considered in my medical decision making (see chart for details).     Reviewed exam and symptoms with patient.  No red flags.  Urine negative for UTI.  Discussed gastroenteritis.  Discharged with Zofran  and Bentyl.  Discussed bland diet/fluids and electrolyte replacement.  PCP follow-up if symptoms do improve.  ER precautions reviewed. Final Clinical Impressions(s) / UC Diagnoses   Final diagnoses:  Gastroenteritis     Discharge Instructions      You may take Zofran  every 8 hours as needed for nausea or vomiting.  May take Bentyl twice daily as needed for abdominal pain or cramping.  Focus on fluid/electrolyte replacement with Gatorade, Powerade, Pedialyte, water.  Bland diet (bananas, rice, applesauce, toast) and advance as you tolerate.  Lots of rest.  Please go to the ER for any worsening symptoms.  Follow-up with your PCP in 2 to 3 days for recheck.  Hope you feel better soon!     ED Prescriptions     Medication Sig Dispense Auth. Provider   ondansetron  (ZOFRAN -ODT) 4 MG  disintegrating tablet Take 1 tablet (4 mg total) by mouth every 8 (eight) hours  as needed for nausea or vomiting. 10 tablet Cassandra Harbold, Jodi R, NP   dicyclomine (BENTYL) 20 MG tablet Take 1 tablet (20 mg total) by mouth 2 (two) times daily as needed (abdominal cramping). 10 tablet Gerardo Territo, Jodi R, NP      PDMP not reviewed this encounter.   Loreda Myla SAUNDERS, NP 01/14/24 [phone]

## 2024-01-14 NOTE — ED Triage Notes (Addendum)
 Pt c/o RLQ, LLQ abdominal pain and nausea started at 0200 today. Pt denies V/D

## 2024-01-14 NOTE — Discharge Instructions (Signed)
 You may take Zofran  every 8 hours as needed for nausea or vomiting.  May take Bentyl twice daily as needed for abdominal pain or cramping.  Focus on fluid/electrolyte replacement with Gatorade, Powerade, Pedialyte, water.  Bland diet (bananas, rice, applesauce, toast) and advance as you tolerate.  Lots of rest.  Please go to the ER for any worsening symptoms.  Follow-up with your PCP in 2 to 3 days for recheck.  Hope you feel better soon!

## 2024-02-25 ENCOUNTER — Ambulatory Visit
Admission: EM | Admit: 2024-02-25 | Discharge: 2024-02-25 | Disposition: A | Discharge status: Home / Self Care | Attending: Family Medicine | Admitting: Family Medicine

## 2024-02-25 DIAGNOSIS — L709 Acne, unspecified: Secondary | ICD-10-CM | POA: Diagnosis not present

## 2024-02-25 DIAGNOSIS — L309 Dermatitis, unspecified: Secondary | ICD-10-CM | POA: Diagnosis not present

## 2024-02-25 MED ORDER — TRIAMCINOLONE ACETONIDE 0.1 % EX CREA: 1.0000 | TOPICAL_CREAM | Freq: Two times a day (BID) | CUTANEOUS | 0 refills | Status: AC

## 2024-02-25 MED ORDER — TRETINOIN 0.025 % EX CREA: TOPICAL_CREAM | Freq: Every day | CUTANEOUS | 0 refills | Status: AC

## 2024-02-25 NOTE — ED Provider Notes (Signed)
 [facility]    CSN: [id number] Arrival date & time: 02/25/24  0934      History   Chief Complaint Chief Complaint  Patient presents with   Rash    HPI Kyle Terry is a 33 y.o. male with a past medical history of eczema presents for rash on abdomen as well as acne.  Patient reports he has had a pruritic rash on his lower abdomen for a couple weeks.  States he works in a [employer] and thinks this is contributing.  He denies any drainage, swelling, fevers or chills.  States he does have eczema but is typically on his arms he is never had it on his abdomen.  Also reports is seem to worsen when he was provided a new shirt to wear by his employer.  He in addition he reports facial acne and had previously been on tretinoin  and would like a refill of this.  He has been using over-the-counter hydrocortisone  cream for his rash.  No other concerns at this time   Rash   History reviewed. No pertinent past medical history.  There are no active problems to display for this patient.   Past Surgical History:  Procedure Laterality Date   APPENDECTOMY         Home Medications    Prior to Admission medications   Medication Sig Start Date End Date Taking? Authorizing Provider  tretinoin  (RETIN-A ) 0.025 % cream Apply topically at bedtime. 02/25/24  Yes Shiya Fogelman, Jodi R, NP  triamcinolone  cream (KENALOG ) 0.1 % Apply 1 Application topically 2 (two) times daily. 02/25/24  Yes Andi Layfield, Jodi R, NP  albuterol  (VENTOLIN  HFA) 108 (90 Base) MCG/ACT inhaler Inhale 1-2 puffs into the lungs every 6 (six) hours as needed. 10/23/23   Anthonee Gelin, Jodi R, NP  cetirizine  (ZYRTEC ) 10 MG tablet Take 1 tablet (10 mg total) by mouth daily. 06/05/22   Hazen Darryle BRAVO, FNP  dicyclomine  (BENTYL ) 20 MG tablet Take 1 tablet (20 mg total) by mouth 2 (two) times daily as needed (abdominal cramping). 01/14/24   Daivd Fredericksen, Jodi R, NP  ondansetron  (ZOFRAN -ODT) 4 MG disintegrating tablet Take 1 tablet (4 mg  total) by mouth every 8 (eight) hours as needed for nausea or vomiting. 01/14/24   Loreda Myla SAUNDERS, NP    Family History Family History  Family history unknown: Yes    Social History Social History   Tobacco Use   Smoking status: Some Days    Types: Cigars    Passive exposure: Never   Smokeless tobacco: Never  Vaping Use   Vaping status: Every Day  Substance Use Topics   Alcohol use: Yes    Comment: barely   Drug use: Not Currently    Types: Marijuana     Allergies   Patient has no known allergies.   Review of Systems Review of Systems  Skin:  Positive for rash.     Physical Exam Triage Vital Signs ED Triage Vitals  Encounter Vitals Group     BP 02/25/24 0944 123/78     Girls Systolic BP Percentile --      Girls Diastolic BP Percentile --      Boys Systolic BP Percentile --      Boys Diastolic BP Percentile --      Pulse Rate 02/25/24 0944 81     Resp 02/25/24 0944 15     Temp 02/25/24 0944 98.4 F (36.9 C)     Temp Source 02/25/24 0944 Oral  SpO2 02/25/24 0944 96 %     Weight --      Height --      Head Circumference --      Peak Flow --      Pain Score 02/25/24 0946 0     Pain Loc --      Pain Education --      Exclude from Growth Chart --    No data found.  Updated Vital Signs BP 123/78 (BP Location: Left Arm)   Pulse 81   Temp 98.4 F (36.9 C) (Oral)   Resp 15   SpO2 96%   Visual Acuity Right Eye Distance:   Left Eye Distance:   Bilateral Distance:    Right Eye Near:   Left Eye Near:    Bilateral Near:     Physical Exam Vitals and nursing note reviewed.  Constitutional:      General: He is not in acute distress.    Appearance: Normal appearance. He is not ill-appearing.  HENT:     Head: Normocephalic and atraumatic.  Eyes:     Pupils: Pupils are equal, round, and reactive to light.  Cardiovascular:     Rate and Rhythm: Normal rate.  Pulmonary:     Effort: Pulmonary effort is normal.  Skin:    General: Skin is warm and  dry.     Findings: Acne present.         Comments: Scattered acne on right temple cheek.  No cysts.  There is a dry rash on the mid lower abdomen.  No drainage or swelling.  See photo.  Neurological:     General: No focal deficit present.     Mental Status: He is alert and oriented to person, place, and time.  Psychiatric:        Mood and Affect: Mood normal.        Behavior: Behavior normal.      UC Treatments / Results  Labs (all labs ordered are listed, but only abnormal results are displayed) Labs Reviewed - No data to display  EKG   Radiology No results found.  Procedures Procedures (including critical care time)  Medications Ordered in UC Medications - No data to display  Initial Impression / Assessment and Plan / UC Course  I have reviewed the triage vital signs and the nursing notes.  Pertinent labs & imaging results that were available during my care of the patient were reviewed by me and considered in my medical decision making (see chart for details).     Reviewed exam and symptoms with patient.  No red flags.  Will start triamcinolone  topically to the abdominal rash twice daily.  Did provide refill of tretinoin  but advised patient he will need additional refills to be provided by his PCP or dermatology.  PCP or dermatology follow-up if his abdominal rash does not improve.  ER precautions reviewed. Final Clinical Impressions(s) / UC Diagnoses   Final diagnoses:  Dermatitis  Acne, unspecified acne type     Discharge Instructions      Start triamcinolone  twice a day to the rash on your abdomen for 1 week.  May start tretinoin  topical acne cream.  Take a pea-sized amount and rub into your skin avoiding your eyes.  Start doing this 3 nights a week and you can slowly work your way up to doing it nightly as this can dry out your skin.  Please follow-up with your PCP or dermatology for treatment of your acne or if  your rash does not improve.  Please go to the ER  for any worsening symptoms.  Hope you feel better soon exclamation    ED Prescriptions     Medication Sig Dispense Auth. Provider   triamcinolone  cream (KENALOG ) 0.1 % Apply 1 Application topically 2 (two) times daily. 30 g Charlie Char, Jodi R, NP   tretinoin  (RETIN-A ) 0.025 % cream Apply topically at bedtime. 45 g Glora Hulgan, Jodi R, NP      PDMP not reviewed this encounter.   Loreda Myla SAUNDERS, NP 02/25/24 1008

## 2024-02-25 NOTE — Discharge Instructions (Signed)
 Start triamcinolone  twice a day to the rash on your abdomen for 1 week.  May start tretinoin  topical acne cream.  Take a pea-sized amount and rub into your skin avoiding your eyes.  Start doing this 3 nights a week and you can slowly work your way up to doing it nightly as this can dry out your skin.  Please follow-up with your PCP or dermatology for treatment of your acne or if your rash does not improve.  Please go to the ER for any worsening symptoms.  Hope you feel better soon exclamation

## 2024-02-25 NOTE — ED Triage Notes (Signed)
 Pt present with eczema flare up. Pt states he does not use a specific cream for relief. Pt reports he has used hydrocortisone .

## 2024-05-06 ENCOUNTER — Other Ambulatory Visit: Payer: Self-pay

## 2024-05-06 ENCOUNTER — Ambulatory Visit
Admission: EM | Admit: 2024-05-06 | Discharge: 2024-05-06 | Disposition: A | Discharge status: Home / Self Care | Attending: Family Medicine | Admitting: Family Medicine

## 2024-05-06 DIAGNOSIS — J069 Acute upper respiratory infection, unspecified: Secondary | ICD-10-CM | POA: Diagnosis not present

## 2024-05-06 HISTORY — DX: Dermatitis, unspecified: L30.9

## 2024-05-06 MED ORDER — IPRATROPIUM BROMIDE 0.03 % NA SOLN: 2.0000 | Freq: Two times a day (BID) | NASAL | 0 refills | Status: AC

## 2024-05-06 NOTE — ED Triage Notes (Signed)
 Pt c/o sneezing, nasal drainage, int nausea, int HA, sore throat, dry coughx2d

## 2024-05-06 NOTE — ED Provider Notes (Signed)
 [facility]    CSN: [id number] Arrival date & time: 05/06/24  1238      History   Chief Complaint No chief complaint on file.   HPI Kyle Terry is a 33 y.o. male  presents for evaluation of URI symptoms for 2 days. Patient reports associated symptoms of mild cough with congestion, headache, intermittent sore throat, sneezing. Denies N/V/D, [place], ear pain, body aches, shortness of breath. Patient does not have a hx of asthma. Patient is an active smoker.   Reports no known sick contacts.  Pt has taken nothing OTC for symptoms. Pt has no other concerns at this time.   HPI  Past Medical History:  Diagnosis Date   Eczema     There are no active problems to display for this patient.   Past Surgical History:  Procedure Laterality Date   APPENDECTOMY         Home Medications    Prior to Admission medications   Medication Sig Start Date End Date Taking? Authorizing Provider  ipratropium (ATROVENT) 0.03 % nasal spray Place 2 sprays into both nostrils every 12 (twelve) hours. 05/06/24  Yes Deyjah Kindel, Jodi R, NP  albuterol  (VENTOLIN  HFA) 108 (90 Base) MCG/ACT inhaler Inhale 1-2 puffs into the lungs every 6 (six) hours as needed. 10/23/23   Keyleigh Manninen, Jodi R, NP  cetirizine  (ZYRTEC ) 10 MG tablet Take 1 tablet (10 mg total) by mouth daily. 06/05/22   Hazen Darryle BRAVO, FNP  dicyclomine  (BENTYL ) 20 MG tablet Take 1 tablet (20 mg total) by mouth 2 (two) times daily as needed (abdominal cramping). 01/14/24   Zella Dewan, Jodi R, NP  ondansetron  (ZOFRAN -ODT) 4 MG disintegrating tablet Take 1 tablet (4 mg total) by mouth every 8 (eight) hours as needed for nausea or vomiting. 01/14/24   Suan Pyeatt, Jodi R, NP  tretinoin  (RETIN-A ) 0.025 % cream Apply topically at bedtime. 02/25/24   Jari Carollo, Jodi R, NP  triamcinolone  cream (KENALOG ) 0.1 % Apply 1 Application topically 2 (two) times daily. 02/25/24   Loreda Myla SAUNDERS, NP    Family History Family History  Family history unknown: Yes    Social  History Social History   Tobacco Use   Smoking status: Some Days    Types: Cigars    Passive exposure: Never   Smokeless tobacco: Never  Vaping Use   Vaping status: Every Day  Substance Use Topics   Alcohol use: Yes    Comment: barely   Drug use: Not Currently    Types: Marijuana     Allergies   Patient has no known allergies.   Review of Systems Review of Systems  HENT:  Positive for congestion, sneezing and sore throat.   Respiratory:  Positive for cough.   Neurological:  Positive for headaches.     Physical Exam Triage Vital Signs ED Triage Vitals  Encounter Vitals Group     BP 05/06/24 1244 118/78     Girls Systolic BP Percentile --      Girls Diastolic BP Percentile --      Boys Systolic BP Percentile --      Boys Diastolic BP Percentile --      Pulse Rate 05/06/24 1244 67     Resp 05/06/24 1244 16     Temp 05/06/24 1244 98 F (36.7 C)     Temp Source 05/06/24 1244 Oral     SpO2 05/06/24 1244 98 %     Weight --      Height --  Head Circumference --      Peak Flow --      Pain Score 05/06/24 1242 7     Pain Loc --      Pain Education --      Exclude from Growth Chart --    No data found.  Updated Vital Signs BP 118/78   Pulse 67   Temp 98 F (36.7 C) (Oral)   Resp 16   SpO2 98%   Visual Acuity Right Eye Distance:   Left Eye Distance:   Bilateral Distance:    Right Eye Near:   Left Eye Near:    Bilateral Near:     Physical Exam Vitals and nursing note reviewed.  Constitutional:      General: He is not in acute distress.    Appearance: Normal appearance. He is not ill-appearing or toxic-appearing.  HENT:     Head: Normocephalic and atraumatic.     Right Ear: Tympanic membrane and ear canal normal.     Left Ear: Tympanic membrane and ear canal normal.     Nose: Congestion present.     Mouth/Throat:     Mouth: Mucous membranes are moist.     Pharynx: No oropharyngeal exudate or posterior oropharyngeal erythema.  Eyes:      Pupils: Pupils are equal, round, and reactive to light.  Cardiovascular:     Rate and Rhythm: Normal rate and regular rhythm.     Heart sounds: Normal heart sounds.  Pulmonary:     Effort: Pulmonary effort is normal.     Breath sounds: Normal breath sounds. No wheezing or rhonchi.  Musculoskeletal:     Cervical back: Normal range of motion and neck supple.  Lymphadenopathy:     Cervical: No cervical adenopathy.  Skin:    General: Skin is warm and dry.  Neurological:     General: No focal deficit present.     Mental Status: He is alert and oriented to person, place, and time.  Psychiatric:        Mood and Affect: Mood normal.        Behavior: Behavior normal.      UC Treatments / Results  Labs (all labs ordered are listed, but only abnormal results are displayed) Labs Reviewed - No data to display  EKG   Radiology No results found.  Procedures Procedures (including critical care time)  Medications Ordered in UC Medications - No data to display  Initial Impression / Assessment and Plan / UC Course  I have reviewed the triage vital signs and the nursing notes.  Pertinent labs & imaging results that were available during my care of the patient were reviewed by me and considered in my medical decision making (see chart for details).     Patient declines COVID or flu testing.  Discussed viral illness and symptomatic treatment.  Atrovent nasal spray as needed for congestion.  Encouraged rest fluids and PCP follow-up if symptoms do not improve.  ER precautions reviewed. Final Clinical Impressions(s) / UC Diagnoses   Final diagnoses:  Viral upper respiratory illness     Discharge Instructions      Please treat your symptoms with over the counter cough medication, tylenol  or ibuprofen , humidifier, and rest.  You may use Atrovent nasal spray twice daily as needed for your nasal congestion.  Viral illnesses can last 7-14 days. Please follow up with your PCP if your  symptoms are not improving. Please go to the ER for any worsening symptoms. This includes  but is not limited to fever you can not control with tylenol  or ibuprofen , you are not able to stay hydrated, you have shortness of breath or chest pain.  Thank you for choosing [facility] for your healthcare needs. I hope you feel better soon!     ED Prescriptions     Medication Sig Dispense Auth. Provider   ipratropium (ATROVENT) 0.03 % nasal spray Place 2 sprays into both nostrils every 12 (twelve) hours. 30 mL Anjelita Sheahan, Jodi R, NP      PDMP not reviewed this encounter.   Loreda Myla SAUNDERS, NP 05/06/24 1300

## 2024-05-06 NOTE — Discharge Instructions (Signed)
 Please treat your symptoms with over the counter cough medication, tylenol  or ibuprofen , humidifier, and rest.  You may use Atrovent nasal spray twice daily as needed for your nasal congestion.  Viral illnesses can last 7-14 days. Please follow up with your PCP if your symptoms are not improving. Please go to the ER for any worsening symptoms. This includes but is not limited to fever you can not control with tylenol  or ibuprofen , you are not able to stay hydrated, you have shortness of breath or chest pain.  Thank you for choosing [facility] for your healthcare needs. I hope you feel better soon!

## 2024-07-23 ENCOUNTER — Ambulatory Visit
Admit: 2024-07-23 | Discharge: 2024-07-23 | Disposition: A | Discharge status: Home / Self Care | Source: Ambulatory Visit | Attending: Nurse Practitioner | Admitting: Nurse Practitioner

## 2024-07-23 DIAGNOSIS — J069 Acute upper respiratory infection, unspecified: Secondary | ICD-10-CM | POA: Diagnosis not present

## 2024-07-23 DIAGNOSIS — R051 Acute cough: Secondary | ICD-10-CM | POA: Diagnosis not present

## 2024-07-23 LAB — POC COVID19/FLU A&B COMBO
Covid Antigen, POC: NEGATIVE
Influenza A Antigen, POC: NEGATIVE
Influenza B Antigen, POC: NEGATIVE

## 2024-07-23 MED ORDER — PROMETHAZINE-DM 6.25-15 MG/5ML PO SYRP: 10.0000 mL | ORAL_SOLUTION | Freq: Four times a day (QID) | ORAL | 0 refills | Status: AC | PRN

## 2024-07-23 MED ORDER — FLUTICASONE PROPIONATE 50 MCG/ACT NA SUSP: 2.0000 | Freq: Every day | NASAL | 0 refills | Status: AC

## 2024-07-23 MED ORDER — CETIRIZINE-PSEUDOEPHEDRINE ER 5-120 MG PO TB12: 1.0000 | ORAL_TABLET | Freq: Every day | ORAL | 0 refills | Status: AC

## 2024-07-23 NOTE — ED Provider Notes (Signed)
 " [facility]    CSN: [id number] Arrival date & time: 07/23/24  1038      History   Chief Complaint Chief Complaint  Patient presents with   Cough   Nasal Congestion    HPI Simpson Paulos is a 34 y.o. male.   Discussed the use of AI scribe software for clinical note transcription with the patient, who gave verbal consent to proceed.   Cordella Pepper presents with cough, runny nose, feeling hot with possible fevers, and post-nasal drainage that has been ongoing for a couple of days. He reports constant sneezing but denies chills, body aches, sore throat, nasal drainage, ear pain, nausea, vomiting, or diarrhea. He is unable to cough anything up and denies wheezing or shortness of breath. He experiences mild headaches that resolve quickly. He has been treating himself with lemon honey tea and ginger, which he typically uses successfully before symptoms worsen. He reports exposure to his uncle who was sick a couple weeks ago with similar symptoms, though the uncle did not seek medical care and used similar home remedies. The patient vapes.   The following sections of the patient's history were reviewed and updated as appropriate: allergies, current medications, past family history, past medical history, past social history, past surgical history, and problem list.      Past Medical History:  Diagnosis Date   Eczema     There are no active problems to display for this patient.   Past Surgical History:  Procedure Laterality Date   APPENDECTOMY         Home Medications    Prior to Admission medications  Medication Sig Start Date End Date Taking? Authorizing Provider  cetirizine -pseudoephedrine  (ZYRTEC -D) 5-120 MG tablet Take 1 tablet by mouth daily with breakfast for 10 days. 07/23/24 08/02/24 Yes Kregg Cihlar, FNP  fluticasone  (FLONASE ) 50 MCG/ACT nasal spray Place 2 sprays into both nostrils daily. Shake well before use. Gently blow nose before spraying.  Do not blow nose immediately after use. You should not taste the medication or feel it going down your throat; if you do, adjust your technique. 07/23/24  Yes Iola Lukes, FNP  promethazine -dextromethorphan (PROMETHAZINE -DM) 6.25-15 MG/5ML syrup Take 10 mLs by mouth every 6 (six) hours as needed for cough. 07/23/24  Yes Iola Lukes, FNP  albuterol  (VENTOLIN  HFA) 108 (90 Base) MCG/ACT inhaler Inhale 1-2 puffs into the lungs every 6 (six) hours as needed. 10/23/23   Mayer, Jodi R, NP  dicyclomine  (BENTYL ) 20 MG tablet Take 1 tablet (20 mg total) by mouth 2 (two) times daily as needed (abdominal cramping). 01/14/24   Mayer, Jodi R, NP  ipratropium (ATROVENT ) 0.03 % nasal spray Place 2 sprays into both nostrils every 12 (twelve) hours. 05/06/24   Mayer, Jodi R, NP  ondansetron  (ZOFRAN -ODT) 4 MG disintegrating tablet Take 1 tablet (4 mg total) by mouth every 8 (eight) hours as needed for nausea or vomiting. 01/14/24   Mayer, Jodi R, NP  tretinoin  (RETIN-A ) 0.025 % cream Apply topically at bedtime. 02/25/24   Mayer, Jodi R, NP  triamcinolone  cream (KENALOG ) 0.1 % Apply 1 Application topically 2 (two) times daily. 02/25/24   Loreda Myla SAUNDERS, NP    Family History Family History  Family history unknown: Yes    Social History Social History[1]   Allergies   Patient has no known allergies.   Review of Systems Review of Systems  Constitutional:  Positive for fever (subjective). Negative for chills.  HENT:  Positive for postnasal drip, rhinorrhea  and sneezing. Negative for congestion, ear pain and sore throat.   Respiratory:  Positive for cough. Negative for shortness of breath and wheezing.   Gastrointestinal:  Negative for diarrhea, nausea and vomiting.  Musculoskeletal:  Negative for myalgias.  Neurological:  Positive for headaches.  All other systems reviewed and are negative.    Physical Exam Triage Vital Signs ED Triage Vitals  Encounter Vitals Group     BP 07/23/24 1150 116/78      Girls Systolic BP Percentile --      Girls Diastolic BP Percentile --      Boys Systolic BP Percentile --      Boys Diastolic BP Percentile --      Pulse Rate 07/23/24 1150 76     Resp 07/23/24 1150 18     Temp 07/23/24 1150 98.4 F (36.9 C)     Temp Source 07/23/24 1150 Oral     SpO2 07/23/24 1150 97 %     Weight --      Height --      Head Circumference --      Peak Flow --      Pain Score 07/23/24 1149 4     Pain Loc --      Pain Education --      Exclude from Growth Chart --    No data found.  Updated Vital Signs BP 116/78   Pulse 76   Temp 98.4 F (36.9 C) (Oral)   Resp 18   SpO2 97%   Visual Acuity Right Eye Distance:   Left Eye Distance:   Bilateral Distance:    Right Eye Near:   Left Eye Near:    Bilateral Near:     Physical Exam Vitals reviewed.  Constitutional:      General: He is awake. He is not in acute distress.    Appearance: Normal appearance. He is well-developed. He is not ill-appearing, toxic-appearing or diaphoretic.  HENT:     Head: Normocephalic.     Right Ear: Hearing, tympanic membrane, ear canal and external ear normal. No drainage, swelling or tenderness. No middle ear effusion. Tympanic membrane is not erythematous.     Left Ear: Hearing, tympanic membrane, ear canal and external ear normal. No drainage, swelling or tenderness.  No middle ear effusion. Tympanic membrane is not erythematous.     Nose: Rhinorrhea present. Rhinorrhea is clear.     Mouth/Throat:     Lips: Pink.     Mouth: Mucous membranes are moist.     Pharynx: Oropharynx is clear. Uvula midline. No pharyngeal swelling, oropharyngeal exudate, posterior oropharyngeal erythema or uvula swelling.     Tonsils: No tonsillar exudate or tonsillar abscesses.  Eyes:     General: Vision grossly intact.     Conjunctiva/sclera: Conjunctivae normal.  Cardiovascular:     Rate and Rhythm: Normal rate and regular rhythm.     Heart sounds: Normal heart sounds.  Pulmonary:     Effort:  Pulmonary effort is normal. No tachypnea or respiratory distress.     Breath sounds: Normal breath sounds and air entry.     Comments: Respirations even and unlabored  Musculoskeletal:        General: Normal range of motion.     Cervical back: Full passive range of motion without pain, normal range of motion and neck supple.  Lymphadenopathy:     Cervical: No cervical adenopathy.  Skin:    General: Skin is warm and dry.  Neurological:  General: No focal deficit present.     Mental Status: He is alert and oriented to person, place, and time.  Psychiatric:        Speech: Speech normal.        Behavior: Behavior is cooperative.      UC Treatments / Results  Labs (all labs ordered are listed, but only abnormal results are displayed) Labs Reviewed  POC COVID19/FLU A&B COMBO    EKG   Radiology No results found.  Procedures Procedures (including critical care time)  Medications Ordered in UC Medications - No data to display  Initial Impression / Assessment and Plan / UC Course  I have reviewed the triage vital signs and the nursing notes.  Pertinent labs & imaging results that were available during my care of the patient were reviewed by me and considered in my medical decision making (see chart for details).     The patient presents with symptoms consistent with a viral upper respiratory infection. FLU & COVID tests were negative. Exam is reassuring and no evidence of bacterial infection or acute cardiopulmonary process is noted. Supportive care is recommended. Patient was advised to follow up with primary care if symptoms do not improve within one week or if new concerns arise. Instructions were given to seek emergency care if symptoms worsen, including shortness of breath, chest pain, persistent high fever, inability to tolerate fluids, or confusion.  Today's evaluation has revealed no signs of a dangerous process. Discussed diagnosis with patient and/or guardian.  Patient and/or guardian aware of their diagnosis, possible red flag symptoms to watch out for and need for close follow up. Patient and/or guardian understands verbal and written discharge instructions. Patient and/or guardian comfortable with plan and disposition.  Patient and/or guardian has a clear mental status at this time, good insight into illness (after discussion and teaching) and has clear judgment to make decisions regarding their care  Documentation was completed with the aid of voice recognition software. Transcription may contain typographical errors.  Final Clinical Impressions(s) / UC Diagnoses   Final diagnoses:  Acute cough  Viral upper respiratory tract infection     Discharge Instructions      Your COVID and flu tests were negative today. Your symptoms are most consistent with a viral respiratory infection involving the nose, throat, or lungs, and antibiotics are not needed since they only treat bacterial infections. Please take any prescribed medications as directed. You may also use over-the-counter medications such as acetaminophen  or ibuprofen  for fever, body aches, or throat discomfort if you are able to take them safely. Saline nasal spray or rinses can be used to help relieve congestion and clear mucus, and sore throat symptoms may improve with lozenges, warm saltwater gargles, or steam from a warm shower. Staying well hydrated is important; drink plenty of fluids and aim for pale yellow urine. A cool mist humidifier, elevating your head when sleeping, and resting can also help you feel more comfortable while your body recovers. Replace your toothbrush once symptoms begin to improve.  A cough may linger for several weeks after other symptoms resolve, which is normal as long as it is gradually improving. Seek emergency care if symptoms worsen, you develop difficulty breathing, chest pain, a new or high fever, cough up blood, or are unable to keep fluids  down.               ED Prescriptions     Medication Sig Dispense Auth. Provider   cetirizine -pseudoephedrine  (ZYRTEC -D) 5-120 MG  tablet Take 1 tablet by mouth daily with breakfast for 10 days. 10 tablet Iola Lukes, FNP   fluticasone  (FLONASE ) 50 MCG/ACT nasal spray Place 2 sprays into both nostrils daily. Shake well before use. Gently blow nose before spraying. Do not blow nose immediately after use. You should not taste the medication or feel it going down your throat; if you do, adjust your technique. 16 g Tiannah Greenly, FNP   promethazine -dextromethorphan (PROMETHAZINE -DM) 6.25-15 MG/5ML syrup Take 10 mLs by mouth every 6 (six) hours as needed for cough. 118 mL Iola Lukes, FNP      PDMP not reviewed this encounter.     [1]  Social History Tobacco Use   Smoking status: Some Days    Types: Cigars    Passive exposure: Never   Smokeless tobacco: Never  Vaping Use   Vaping status: Every Day  Substance Use Topics   Alcohol use: Yes    Comment: barely   Drug use: Not Currently    Types: Marijuana     Iola Lukes, FNP 07/23/24 1257  "

## 2024-07-23 NOTE — Discharge Instructions (Addendum)
 Your COVID and flu tests were negative today. Your symptoms are most consistent with a viral respiratory infection involving the nose, throat, or lungs, and antibiotics are not needed since they only treat bacterial infections. Please take any prescribed medications as directed. You may also use over-the-counter medications such as acetaminophen or ibuprofen for fever, body aches, or throat discomfort if you are able to take them safely. Saline nasal spray or rinses can be used to help relieve congestion and clear mucus, and sore throat symptoms may improve with lozenges, warm saltwater gargles, or steam from a warm shower. Staying well hydrated is important; drink plenty of fluids and aim for pale yellow urine. A cool mist humidifier, elevating your head when sleeping, and resting can also help you feel more comfortable while your body recovers. Replace your toothbrush once symptoms begin to improve.  A cough may linger for several weeks after other symptoms resolve, which is normal as long as it is gradually improving. Seek emergency care if symptoms worsen, you develop difficulty breathing, chest pain, a new or high fever, cough up blood, or are unable to keep fluids down.

## 2024-07-23 NOTE — ED Triage Notes (Signed)
 Pt present with c/o runny nose, cough x 2 days. Pt states he has been feeling hot since last night. C/o throat drainage.  Home interventions: lemon honey teas
